# Patient Record
Sex: Female | Born: 1970 | Race: Black or African American | Hispanic: No | Marital: Single | Smoking: Current some day smoker
Health system: Southern US, Community
[De-identification: ages and names within clinical notes are randomized; demographics above are authoritative.]

## PROBLEM LIST (undated history)

## (undated) DIAGNOSIS — I10 Essential (primary) hypertension: Secondary | ICD-10-CM

## (undated) HISTORY — DX: Essential (primary) hypertension: I10

## (undated) HISTORY — PX: LASIK: SHX215

---

## 1999-04-09 ENCOUNTER — Ambulatory Visit (HOSPITAL_COMMUNITY): Admission: RE | Admit: 1999-04-09 | Discharge: 1999-04-09 | Payer: Self-pay | Admitting: *Deleted

## 1999-06-20 ENCOUNTER — Inpatient Hospital Stay (HOSPITAL_COMMUNITY): Admission: AD | Admit: 1999-06-20 | Discharge: 1999-06-20 | Payer: Self-pay | Admitting: *Deleted

## 1999-06-22 ENCOUNTER — Observation Stay (HOSPITAL_COMMUNITY): Admission: AD | Admit: 1999-06-22 | Discharge: 1999-06-23 | Payer: Self-pay | Admitting: *Deleted

## 1999-09-03 ENCOUNTER — Encounter: Payer: Self-pay | Admitting: *Deleted

## 1999-09-03 ENCOUNTER — Ambulatory Visit (HOSPITAL_COMMUNITY): Admission: RE | Admit: 1999-09-03 | Discharge: 1999-09-03 | Payer: Self-pay | Admitting: *Deleted

## 1999-10-06 ENCOUNTER — Inpatient Hospital Stay (HOSPITAL_COMMUNITY): Admission: AD | Admit: 1999-10-06 | Discharge: 1999-10-08 | Payer: Self-pay | Admitting: *Deleted

## 1999-10-19 ENCOUNTER — Inpatient Hospital Stay (HOSPITAL_COMMUNITY): Admission: AD | Admit: 1999-10-19 | Discharge: 1999-10-19 | Payer: Self-pay | Admitting: *Deleted

## 1999-10-20 ENCOUNTER — Inpatient Hospital Stay (HOSPITAL_COMMUNITY): Admission: AD | Admit: 1999-10-20 | Discharge: 1999-10-20 | Payer: Self-pay | Admitting: *Deleted

## 1999-10-21 ENCOUNTER — Inpatient Hospital Stay (HOSPITAL_COMMUNITY): Admission: EM | Admit: 1999-10-21 | Discharge: 1999-10-21 | Payer: Self-pay | Admitting: *Deleted

## 1999-11-18 ENCOUNTER — Inpatient Hospital Stay (HOSPITAL_COMMUNITY): Admission: AD | Admit: 1999-11-18 | Discharge: 1999-11-18 | Payer: Self-pay | Admitting: *Deleted

## 2000-02-10 ENCOUNTER — Inpatient Hospital Stay (HOSPITAL_COMMUNITY): Admission: AD | Admit: 2000-02-10 | Discharge: 2000-02-10 | Payer: Self-pay | Admitting: *Deleted

## 2001-12-15 ENCOUNTER — Emergency Department (HOSPITAL_COMMUNITY): Admission: EM | Admit: 2001-12-15 | Discharge: 2001-12-15 | Payer: Self-pay | Admitting: Emergency Medicine

## 2001-12-20 ENCOUNTER — Emergency Department (HOSPITAL_COMMUNITY): Admission: EM | Admit: 2001-12-20 | Discharge: 2001-12-20 | Payer: Self-pay | Admitting: Emergency Medicine

## 2001-12-28 ENCOUNTER — Other Ambulatory Visit: Admission: RE | Admit: 2001-12-28 | Discharge: 2001-12-28 | Payer: Self-pay | Admitting: Obstetrics and Gynecology

## 2002-05-02 ENCOUNTER — Inpatient Hospital Stay: Admission: AD | Admit: 2002-05-02 | Discharge: 2002-05-02 | Payer: Self-pay | Admitting: Obstetrics and Gynecology

## 2002-05-16 ENCOUNTER — Inpatient Hospital Stay (HOSPITAL_COMMUNITY): Admission: AD | Admit: 2002-05-16 | Discharge: 2002-05-16 | Payer: Self-pay | Admitting: Obstetrics and Gynecology

## 2002-06-07 ENCOUNTER — Inpatient Hospital Stay (HOSPITAL_COMMUNITY): Admission: AD | Admit: 2002-06-07 | Discharge: 2002-06-10 | Payer: Self-pay | Admitting: Obstetrics and Gynecology

## 2004-01-17 ENCOUNTER — Inpatient Hospital Stay (HOSPITAL_COMMUNITY): Admission: AD | Admit: 2004-01-17 | Discharge: 2004-01-17 | Payer: Self-pay | Admitting: Obstetrics & Gynecology

## 2004-03-25 ENCOUNTER — Ambulatory Visit (HOSPITAL_COMMUNITY): Admission: RE | Admit: 2004-03-25 | Discharge: 2004-03-25 | Payer: Self-pay | Admitting: *Deleted

## 2004-04-01 ENCOUNTER — Ambulatory Visit (HOSPITAL_COMMUNITY): Admission: RE | Admit: 2004-04-01 | Discharge: 2004-04-01 | Payer: Self-pay | Admitting: *Deleted

## 2004-06-12 ENCOUNTER — Ambulatory Visit (HOSPITAL_COMMUNITY): Admission: RE | Admit: 2004-06-12 | Discharge: 2004-06-12 | Payer: Self-pay | Admitting: *Deleted

## 2004-07-04 ENCOUNTER — Observation Stay (HOSPITAL_COMMUNITY): Admission: AD | Admit: 2004-07-04 | Discharge: 2004-07-05 | Payer: Self-pay | Admitting: *Deleted

## 2004-07-18 ENCOUNTER — Inpatient Hospital Stay (HOSPITAL_COMMUNITY): Admission: AD | Admit: 2004-07-18 | Discharge: 2004-07-20 | Payer: Self-pay | Admitting: *Deleted

## 2004-07-21 ENCOUNTER — Ambulatory Visit: Payer: Self-pay | Admitting: *Deleted

## 2004-07-27 ENCOUNTER — Inpatient Hospital Stay (HOSPITAL_COMMUNITY): Admission: AD | Admit: 2004-07-27 | Discharge: 2004-07-27 | Payer: Self-pay | Admitting: Family Medicine

## 2004-07-31 ENCOUNTER — Encounter: Admission: RE | Admit: 2004-07-31 | Discharge: 2004-07-31 | Payer: Self-pay | Admitting: *Deleted

## 2004-08-08 ENCOUNTER — Ambulatory Visit: Payer: Self-pay | Admitting: Family Medicine

## 2004-08-08 ENCOUNTER — Inpatient Hospital Stay (HOSPITAL_COMMUNITY): Admission: AD | Admit: 2004-08-08 | Discharge: 2004-08-08 | Payer: Self-pay | Admitting: Gynecology

## 2004-08-11 ENCOUNTER — Ambulatory Visit: Payer: Self-pay | Admitting: Family Medicine

## 2004-08-11 ENCOUNTER — Inpatient Hospital Stay (HOSPITAL_COMMUNITY): Admission: AD | Admit: 2004-08-11 | Discharge: 2004-08-11 | Payer: Self-pay | Admitting: Obstetrics and Gynecology

## 2004-08-11 ENCOUNTER — Inpatient Hospital Stay (HOSPITAL_COMMUNITY): Admission: AD | Admit: 2004-08-11 | Discharge: 2004-08-12 | Payer: Self-pay | Admitting: Obstetrics and Gynecology

## 2004-08-12 ENCOUNTER — Inpatient Hospital Stay (HOSPITAL_COMMUNITY): Admission: AD | Admit: 2004-08-12 | Discharge: 2004-08-13 | Payer: Self-pay | Admitting: *Deleted

## 2004-08-12 ENCOUNTER — Ambulatory Visit: Payer: Self-pay | Admitting: Family Medicine

## 2004-08-13 ENCOUNTER — Inpatient Hospital Stay (HOSPITAL_COMMUNITY): Admission: AD | Admit: 2004-08-13 | Discharge: 2004-08-15 | Payer: Self-pay | Admitting: Obstetrics and Gynecology

## 2005-01-31 IMAGING — US US OB DETAIL+14 WK
1 series · 13 of 28 positions shown · non-contrast
Comparison: none

CLINICAL DATA: Increased risk for Down syndrome by serum screening.  G5 P4. EDC 09/05/04 by first ultrasound.

[Series 1: unknown · 0.26mm/px · 13 of 80 slices shown]
[im 3/80]
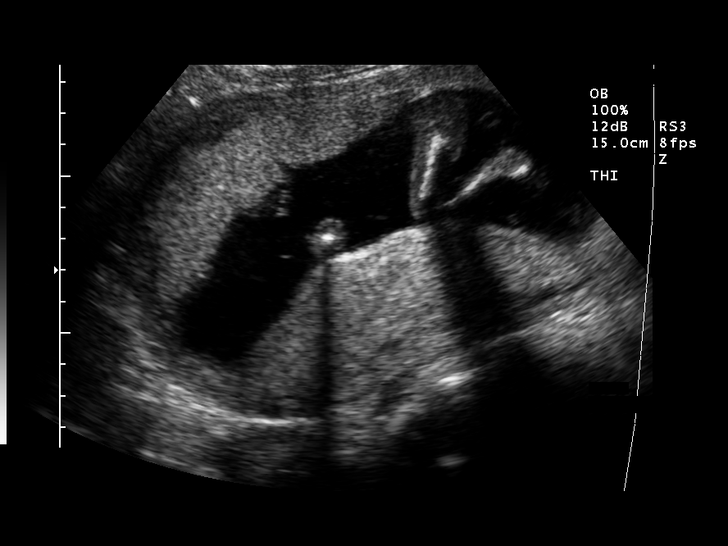
[im 9/80]
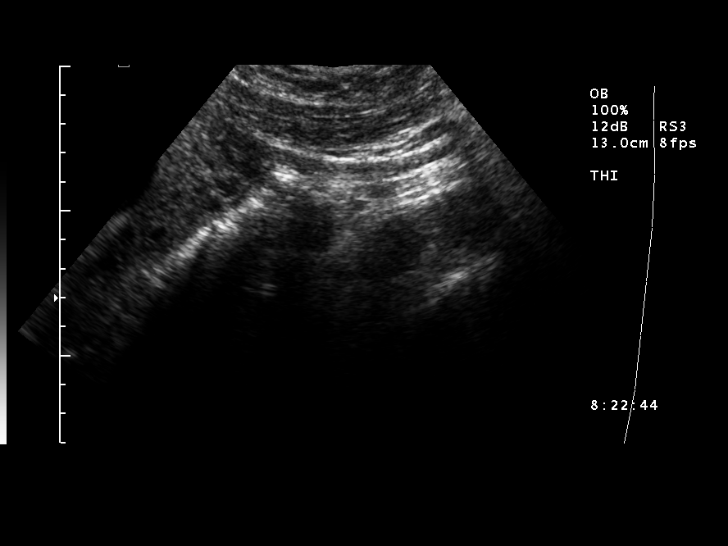
[im 15/80]
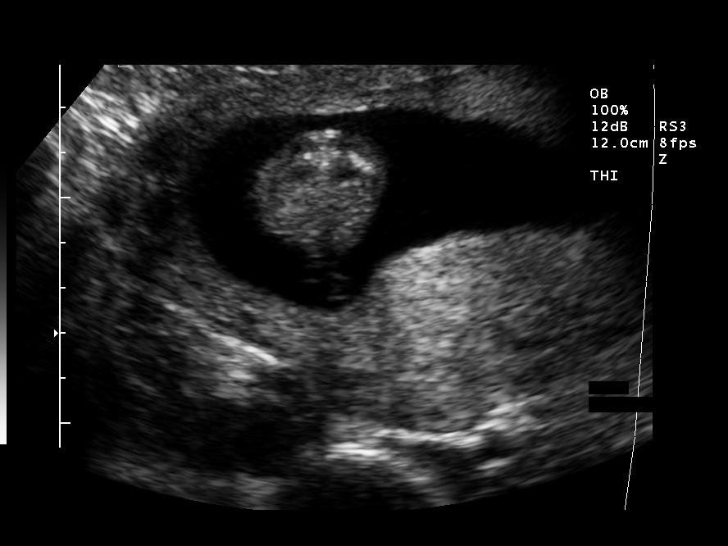
[im 21/80]
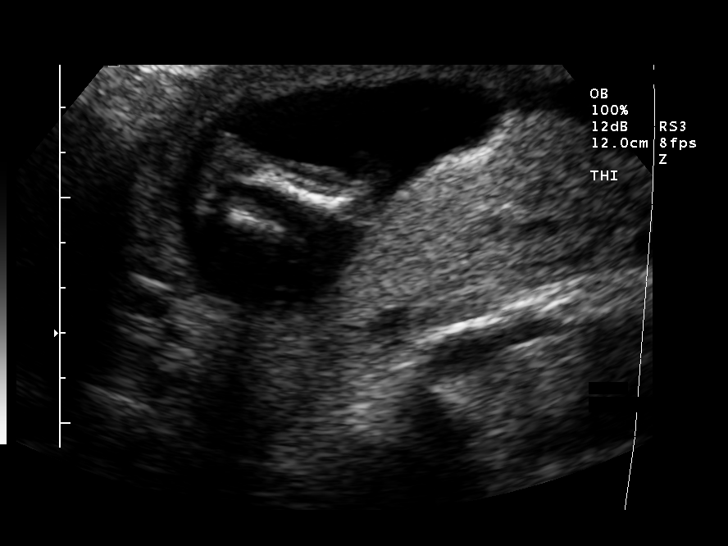
[im 27/80]
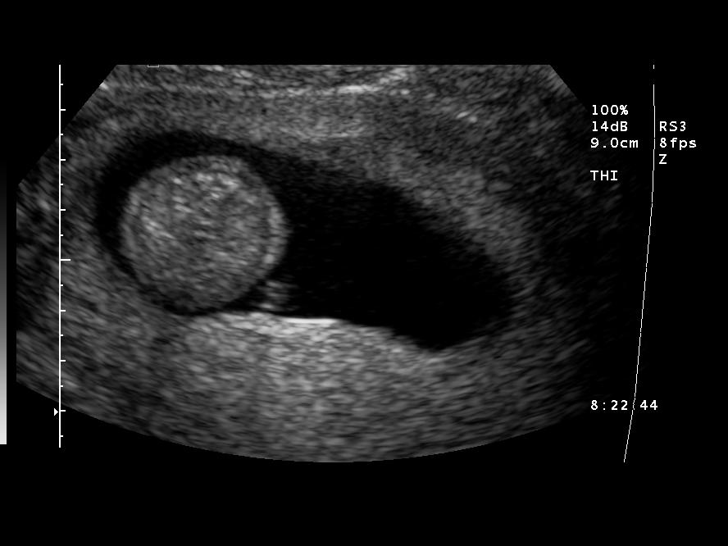
[im 33/80]
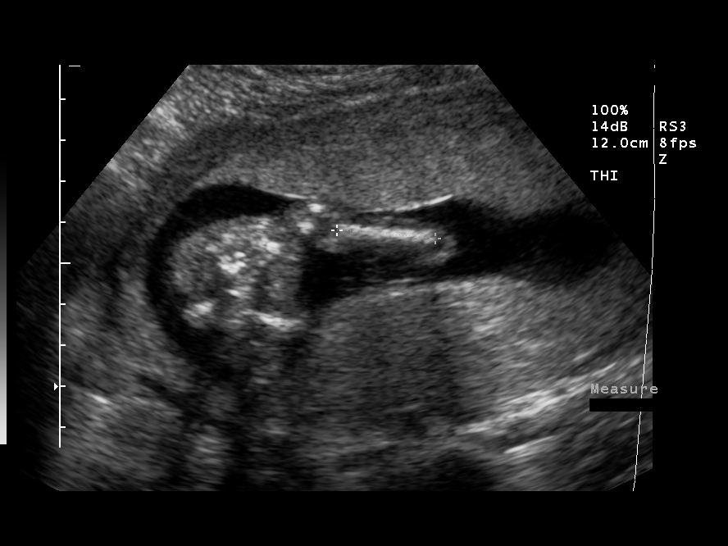
[im 41/80]
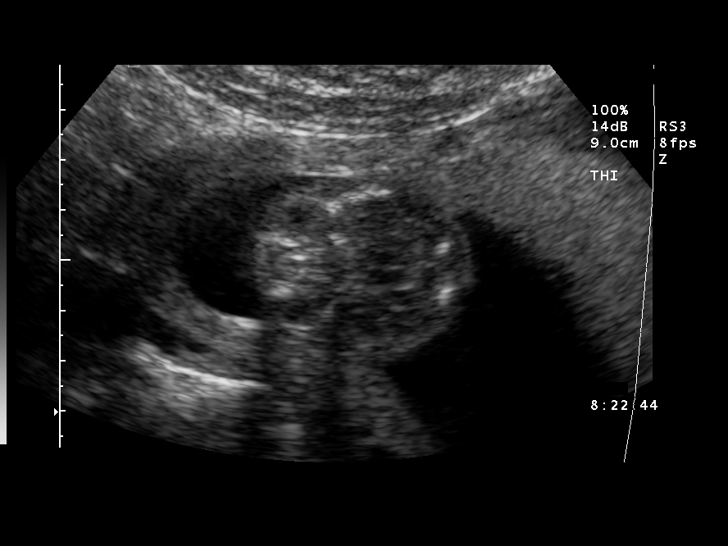
[im 47/80]
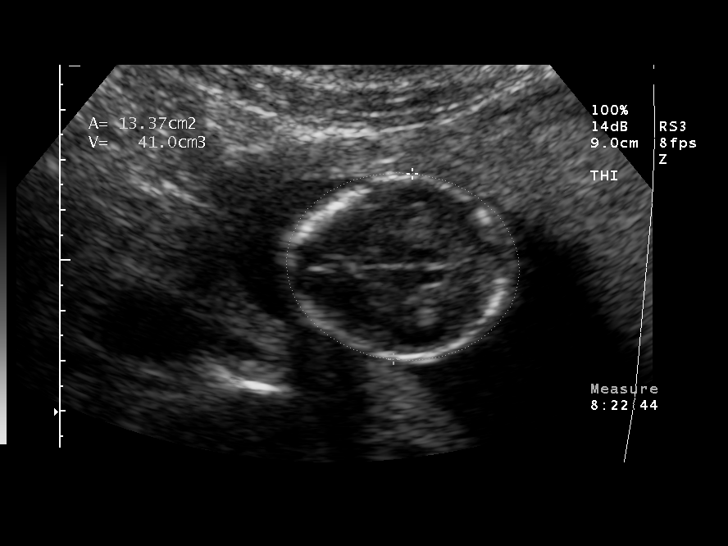
[im 53/80]
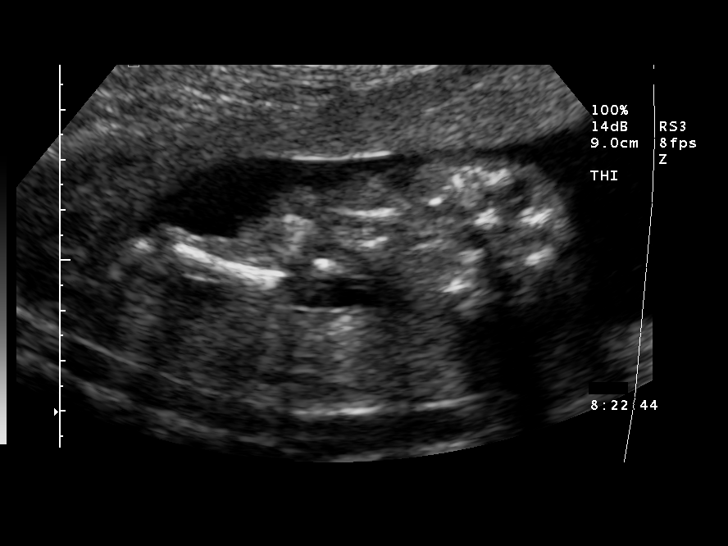
[im 59/80]
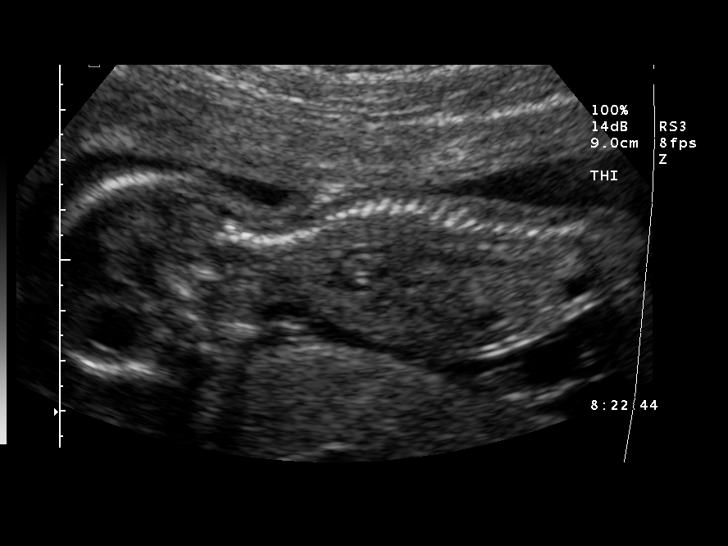
[im 65/80]
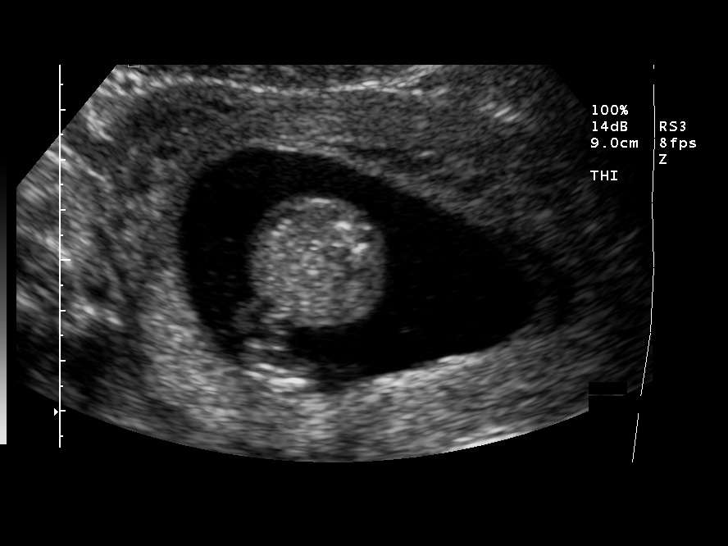
[im 71/80]
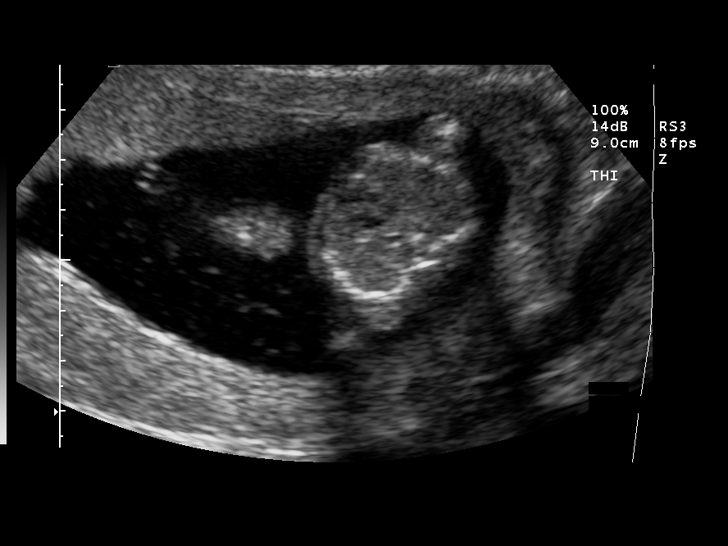
[im 77/80]
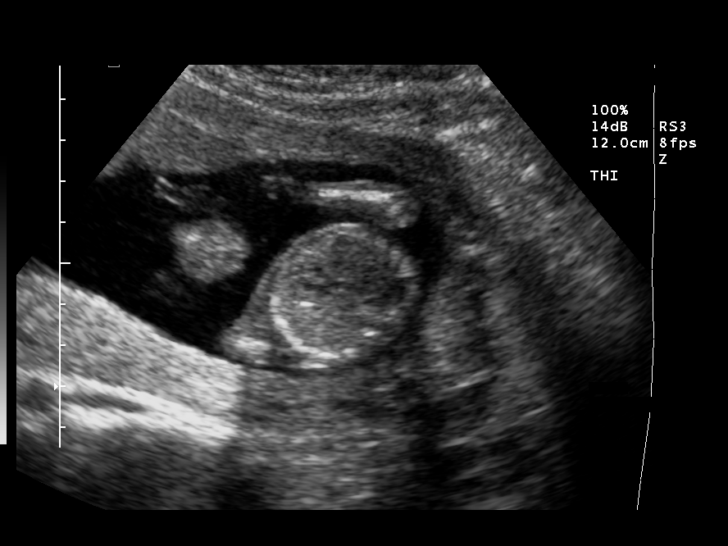

[13 of 28 positions shown; findings below may reference images not displayed]

DETAILED OBSTETRICAL ULTRASOUND 

Number of Fetuses:  1
Heart Rate:  168
Movement:  Yes
Breathing:  No
Presentation:  Breech
Placental Location:  Anterior
Grade:  I
Previa:  No
Amniotic Fluid (Subjective):  Normal
Amniotic Fluid (Objective):  3.8 cm Vertical pocket 

FETAL BIOMETRY
BPD:  3.6 cm   17 w 0 d
HC:  13.2 cm   16 w 5 d
AC:  10.5 cm   16 w 3 d
FL:  2.0 cm   16 w 0 d
HL:  2.3 cm  17 w 1 d

MEAN GA:  16 w 5 d
1ST US GA:  16 w 4 d (Assigned)
FETAL ANATOMY
Lateral Ventricles:  Visualized 
Thalami/CSP:  Visualized 
Posterior Fossa:  Visualized 
Nuchal Region:  Visualized 
Spine:  Visualized 
4 Chamber Heart on Left:  Not visualized 
Stomach on Left:  Visualized 
3 Vessel Cord:  Visualized 
Cord Insertion Site:  Visualized 
Kidneys:  Visualized 
Bladder:  Visualized 
Extremities:  Visualized 

ADDITIONAL ANATOMY VISUALIZED:  Upper lip, orbits, diaphragm, heel, 5th digit, ductal arch, aortic arch, and female genitalia
MATERNAL FINDINGS
Cervix:  3.3 cm Transabdominally
IMPRESSION: Single living intrauterine fetus in breech presentation.  Patient is 16 weeks and 4 days by first ultrasound and measures 16 weeks 5 days today indicating appropriate growth.
Cardiac anatomy and profile (including nasal bone) not seen.  Otherwise, no anatomic abnormality noted.  Patient was informed that ultrasound cannot exclude Down syndrome.

</u12:p>

## 2005-02-05 ENCOUNTER — Emergency Department (HOSPITAL_COMMUNITY): Admission: EM | Admit: 2005-02-05 | Discharge: 2005-02-05 | Payer: Self-pay | Admitting: Emergency Medicine

## 2005-06-25 ENCOUNTER — Emergency Department (HOSPITAL_COMMUNITY): Admission: EM | Admit: 2005-06-25 | Discharge: 2005-06-25 | Payer: Self-pay | Admitting: Emergency Medicine

## 2006-05-17 ENCOUNTER — Emergency Department (HOSPITAL_COMMUNITY): Admission: EM | Admit: 2006-05-17 | Discharge: 2006-05-17 | Payer: Self-pay | Admitting: Emergency Medicine

## 2008-06-30 ENCOUNTER — Emergency Department (HOSPITAL_COMMUNITY): Admission: EM | Admit: 2008-06-30 | Discharge: 2008-06-30 | Payer: Self-pay | Admitting: Emergency Medicine

## 2008-12-11 ENCOUNTER — Inpatient Hospital Stay (HOSPITAL_COMMUNITY): Admission: AD | Admit: 2008-12-11 | Discharge: 2008-12-11 | Payer: Self-pay | Admitting: Obstetrics & Gynecology

## 2009-04-26 ENCOUNTER — Inpatient Hospital Stay (HOSPITAL_COMMUNITY): Admission: AD | Admit: 2009-04-26 | Discharge: 2009-04-26 | Payer: Self-pay | Admitting: Obstetrics & Gynecology

## 2010-06-03 ENCOUNTER — Inpatient Hospital Stay (HOSPITAL_COMMUNITY): Admission: AD | Admit: 2010-06-03 | Discharge: 2010-06-03 | Payer: Self-pay | Admitting: Obstetrics & Gynecology

## 2010-06-03 ENCOUNTER — Ambulatory Visit: Payer: Self-pay | Admitting: Nurse Practitioner

## 2010-09-29 ENCOUNTER — Inpatient Hospital Stay (HOSPITAL_COMMUNITY): Admission: AD | Admit: 2010-09-29 | Discharge: 2010-09-29 | Payer: Self-pay | Admitting: Obstetrics & Gynecology

## 2010-09-29 ENCOUNTER — Ambulatory Visit: Payer: Self-pay | Admitting: Family

## 2010-09-29 ENCOUNTER — Inpatient Hospital Stay (HOSPITAL_COMMUNITY): Admission: AD | Admit: 2010-09-29 | Discharge: 2010-09-30 | Payer: Self-pay | Admitting: Family Medicine

## 2011-03-05 LAB — URINE MICROSCOPIC-ADD ON

## 2011-03-05 LAB — HCG, QUANTITATIVE, PREGNANCY: hCG, Beta Chain, Quant, S: <2 m[IU]/mL (ref <5)

## 2011-03-05 LAB — CBC
MCHC: 34.1 g/dL (ref 30.0–36.0)
RBC: 3.31 MIL/uL — ABNORMAL LOW (ref 3.87–5.11)
RDW: 12.3 % (ref 11.5–15.5)
WBC: 6.4 10*3/uL (ref 4.0–10.5)

## 2011-03-05 LAB — URINALYSIS, ROUTINE W REFLEX MICROSCOPIC
Bilirubin Urine: NEGATIVE
Ketones, ur: NEGATIVE mg/dL

## 2011-03-05 LAB — POCT PREGNANCY, URINE: Preg Test, Ur: NEGATIVE

## 2011-03-09 LAB — URINALYSIS, ROUTINE W REFLEX MICROSCOPIC
Bilirubin Urine: NEGATIVE
Hgb urine dipstick: NEGATIVE
Nitrite: NEGATIVE
Protein, ur: NEGATIVE mg/dL
Urobilinogen, UA: 1 mg/dL (ref 0.0–1.0)

## 2011-03-31 LAB — CBC
HCT: 36.7 % (ref 36.0–46.0)
Hemoglobin: 12.9 g/dL (ref 12.0–15.0)
MCV: 101.7 fL — ABNORMAL HIGH (ref 78.0–100.0)
Platelets: 166 10*3/uL (ref 150–400)

## 2011-03-31 LAB — GC/CHLAMYDIA PROBE AMP, GENITAL: GC Probe Amp, Genital: NEGATIVE

## 2011-03-31 LAB — URINALYSIS, ROUTINE W REFLEX MICROSCOPIC
Bilirubin Urine: NEGATIVE
Hgb urine dipstick: NEGATIVE
Nitrite: NEGATIVE
Protein, ur: NEGATIVE mg/dL
Urobilinogen, UA: 0.2 mg/dL (ref 0.0–1.0)

## 2011-03-31 LAB — DIFFERENTIAL
Basophils Absolute: 0 10*3/uL (ref 0.0–0.1)
Basophils Relative: 1 % (ref 0–1)
Eosinophils Absolute: 0.1 10*3/uL (ref 0.0–0.7)
Lymphocytes Relative: 36 % (ref 12–46)
Lymphs Abs: 1.8 10*3/uL (ref 0.7–4.0)
Neutrophils Relative %: 56 % (ref 43–77)

## 2011-03-31 LAB — WET PREP, GENITAL
Trich, Wet Prep: NONE SEEN
Yeast Wet Prep HPF POC: NONE SEEN

## 2011-05-08 NOTE — Discharge Summary (Signed)
NAME:  Alicia Fry, Alicia Fry                         ACCOUNT NO.:  [id number]   MEDICAL RECORD NO.:  [id number]                   PATIENT TYPE:  INP   LOCATION:  9169                                 FACILITY:  [facility]   PHYSICIAN:  Conni Elliot, M.D.             DATE OF BIRTH:  10-07-1971   DATE OF ADMISSION:  07/18/2004  DATE OF DISCHARGE:  07/20/2004                                 DISCHARGE SUMMARY   ADMISSION DIAGNOSES:  16. A 40 year old G5, P4-0-0-4, at 33 weeks 5 days.  2. Preterm labor.   DISCHARGE DIAGNOSES:  93. A 40 year old G5, P4-0-0-4, at 34 weeks.  2. Cessation of preterm labor.   DISCHARGE MEDICATIONS:  Prenatal vitamin one tablet p.o. daily.   ADMISSION HISTORY:  Ms. Alicia Fry is a 40 year old G5, P4-0-0-4, that presents  to the MAU at 33 weeks 5 days complaining of uterine contractions that  started at 3:30 a.m. on the morning of July 25.  Contractions were occurring  every five minutes.  The patient denied vaginal bleeding or leakage of  fluid.  Positive fetal activity and contractions.  The patient also was  complaining of back pain and bilateral leg numbness.   HOSPITAL COURSE:  The patient was assessed in the MAU.  Vital signs:  Temperature 98.8, pulse 70, respiration rate 20, BP 131/76.  Digital  cervical exam 1, soft, and high.  FHTs 140-150.  Positive variability.  Positive reactivity.  No decelerations.  The patient was having uterine  contractions every five to seven minutes, which decreased to irregular  uterine irritability pattern.  The patient was admitted to the L&D unit and  was given terbutaline 0.25 mg x1 dose, was also started un Unasyn 3 g IV  q.6h.  Uterine contractions resolved until the next day, in which she  received one dosage of terbutaline 0.25 mg.  Uterine irritability has  resolved, the patient's back pain better since admission.  On day of  discharge, July 20, 2004, the patient's __________ is unchanged an was  discharged home in stable  condition.   CONDITION ON DISCHARGE:  Stable.  No contractions.   INSTRUCTIONS GIVEN TO PATIENT:  The patient was advised to be on bed rest as  much as possible.  The patient stated that she has four kids at home, but  she will stay at her mother's house for social support.  The patient was  advised that she needs to return to the MAU if she starts contracting or if  she has bleeding or leakage of fluid.  The patient voiced understanding to  plan of care.  The patient also voiced that she will make a follow-up  appointment in the high-risk clinic on Wednesday, August 3.     Bonnita Hollow, M.D.               Conni Elliot, M.D.    VRE/MEDQ  D:  07/20/2004  T:  07/21/2004  Job:  [id number]

## 2011-05-08 NOTE — H&P (Signed)
[facility]  Patient:    Alicia Fry, Alicia Fry Visit Number: [id number] MRN: [id number]          Service Type: OBS Location: 910B [id number] [id number] Attending Physician:  Leonard Schwartz Dictated by:   Marcelle Smiling Clelia Croft, C.N.M. Admit Date:  06/07/2002                           History and Physical  TWENTY-THREE HOUR OBSERVATION  DATE OF BIRTH:                02/23/1971  HISTORY OF PRESENT ILLNESS:   The patient is a 40 year old single black female gravida 4 para 3-0-0-3 at 65 and five sevenths weeks who presents with regular uterine contractions since 3 p.m. that became stronger and regular since 8:30 p.m.  She denies leaking, bleeding, headache, nausea and vomiting, and visual disturbances.  She reports positive fetal movement.  Her pregnancy has been followed by the [facility] certified nurse midwife service and has been remarkable for: 1. Smoker. 2. History of positive group B strep and positive group B strep this    pregnancy. 3. History of sexually transmitted diseases.  PRENATAL LABORATORY DATA:     Her prenatal lab work was collected on December 28, 2001.  Hemoglobin 12.0; hematocrit 34.3; platelets 202,000.  Blood type O positive, antibody negative.  Sickle cell trait negative.  RPR nonreactive. Rubella immune.  Hepatitis B surface antigen negative.  HIV nonreactive.  Pap smear within normal limits.  Gonorrhea negative, chlamydia negative.  Maternal serum alpha-fetoprotein within normal range.  On March 23, 2002 her one-hour Glucola was 112 and her hemoglobin at that time was 11.2.  Culture of the vaginal tract on May 04, 2002 in maternity admissions was negative.  Culture of the vaginal tract for group B strep on May 19, 2002 at [facility] was positive.  HISTORY OF PRESENT PREGNANCY:                    She presented for care on December 28, 2001 at approximately 15.[redacted] weeks gestation.  She was measuring size greater  than dates at that time and pregnancy ultrasonography was performed at [redacted] weeks gestation that showed growth consistent with last menstrual period, giving her an EDD of June 16, 2002.  The patient remained normotensive with negative proteinuria throughout her prenatal care.  She was evaluated at 34 weeks in maternity admissions with contractions, for which she was given terbutaline.  She was measuring size greater than dates in the third trimester and therefore had a second pregnancy ultrasonography at [redacted] weeks gestation which showed appropriate growth at the 47th percentile with normal fluid.  The rest of her prenatal care was unremarkable.  OBSTETRICAL HISTORY:          She is a gravida 4 para 3-0-0-3.  In June 1995 she vaginally delivered a female infant at [redacted] weeks gestation after 4-6 hours in labor.  He weighed 6 pounds 10 ounces.  His name is Marquee.  She had no complications with that birth and she had chlamydia and Trichomonas with that pregnancy.  In June 1998 she vaginally delivered a female infant after 4-6 hours of labor at [redacted] weeks gestation.  The infant weighed 6 pounds 10 ounces. Her name was Destiny.  In October 2000 she vaginally delivered a female infant at [redacted] weeks gestation after 8 hours in labor.  The infant  weighed 6 pounds 10 ounces.  Her name is [place].  She had group B strep with that pregnancy. She had a history of anemia with her first pregnancy.  ALLERGIES:                    No medication allergies.  MEDICAL HISTORY:              She reports having had the usual childhood illnesses.  She was treated for chlamydia with her first pregnancy, treated for Trichomonas with her first pregnancy, and reports having a abnormal Pap smear x1 with repeat being normal.  SURGICAL HISTORY:             Remarkable for a laser surgery on her eye two years ago.  FAMILY HISTORY:               Remarkable for paternal grandmother with myocardial infarction.  Father with heart  disease.  Mother, father, sister, and paternal grandmother with chronic hypertension.  Mother, sisters, and brother with diabetes.  Sister has chronic renal disease secondary to diabetes.  Paternal grandmother with a history of CVA.  GENETIC HISTORY:              Remarkable for father of the baby born with extra finger.  Father of the baby with possible sickle cell trait.  SOCIAL HISTORY:               The father-of-the-babys name is [place].  He is involved and supportive.  They are of the [employer]al faith.  The patient has some college education.  The father of the baby has 11 years of high school education.  The patient smoked throughout the pregnancy but  cut down significantly to approximately one cigarette per week.  Denies any alcohol or illicit drug use with the pregnancy.  OBJECTIVE DATA:  VITAL SIGNS:                  Stable.  She is afebrile.  HEENT:                        Grossly within normal limits.  CHEST:                        Clear to auscultation.  HEART:                        Regular to rate and rhythm.  ABDOMEN:                      Gravid in contour with fundal height extending approximately 40 cm above the pubic symphysis.  Electronic fetal monitoring is remarkable for fetal heart rate with decelerations in relation to uterine contractions x2 down to the 80s lasting a minute-and-a-half.  Spontaneous recovery after application of O2 and position changes.  Uterine contractions every 15 minutes with intermittent irritability.  PELVIC:                       Cervical exam is tight 1 cm, 50% effaced, multiparous and soft, posterior, vertex high.  EXTREMITIES:                  Within normal limits.  ASSESSMENT:                   1. Intrauterine pregnancy at term.  2. Prodromal versus early labor.                               3. Fetal heart rate changes.  PLAN:                         1. Admit to birthing suites per consult with                                   Dr. Stefano Gaul.                               2. Observe fetal heart rate overnight.                                3. Given the status of her unfavorable cervix,                                  induction of labor is not recommended at this                                  time.Dictated by:   Marcelle Smiling Clelia Croft, C.N.M.  Attending Physician:  Leonard Schwartz DD:  06/08/02 TD:  06/08/02 Job: [id number]

## 2011-09-13 ENCOUNTER — Emergency Department (HOSPITAL_COMMUNITY): Payer: Medicaid Other

## 2011-09-13 ENCOUNTER — Emergency Department (HOSPITAL_COMMUNITY)
Admission: EM | Admit: 2011-09-13 | Discharge: 2011-09-14 | Disposition: A | Discharge status: Home / Self Care | Payer: Medicaid Other | Attending: Emergency Medicine | Admitting: Emergency Medicine

## 2011-09-13 DIAGNOSIS — M542 Cervicalgia: Secondary | ICD-10-CM | POA: Insufficient documentation

## 2011-09-13 DIAGNOSIS — M25519 Pain in unspecified shoulder: Secondary | ICD-10-CM | POA: Insufficient documentation

## 2011-09-13 DIAGNOSIS — M79609 Pain in unspecified limb: Secondary | ICD-10-CM | POA: Insufficient documentation

## 2011-09-13 DIAGNOSIS — T148XXA Other injury of unspecified body region, initial encounter: Secondary | ICD-10-CM | POA: Insufficient documentation

## 2011-09-13 DIAGNOSIS — S12400A Unspecified displaced fracture of fifth cervical vertebra, initial encounter for closed fracture: Secondary | ICD-10-CM | POA: Insufficient documentation

## 2011-09-13 DIAGNOSIS — Z23 Encounter for immunization: Secondary | ICD-10-CM | POA: Insufficient documentation

## 2011-09-13 DIAGNOSIS — R51 Headache: Secondary | ICD-10-CM | POA: Insufficient documentation

## 2011-09-18 ENCOUNTER — Emergency Department (HOSPITAL_COMMUNITY)
Admission: EM | Admit: 2011-09-18 | Discharge: 2011-09-18 | Disposition: A | Discharge status: Home / Self Care | Payer: Medicaid Other | Attending: Emergency Medicine | Admitting: Emergency Medicine

## 2011-09-18 DIAGNOSIS — M79609 Pain in unspecified limb: Secondary | ICD-10-CM | POA: Insufficient documentation

## 2011-09-18 DIAGNOSIS — M25519 Pain in unspecified shoulder: Secondary | ICD-10-CM | POA: Insufficient documentation

## 2011-09-18 DIAGNOSIS — IMO0002 Reserved for concepts with insufficient information to code with codable children: Secondary | ICD-10-CM | POA: Insufficient documentation

## 2011-09-18 DIAGNOSIS — M542 Cervicalgia: Secondary | ICD-10-CM | POA: Insufficient documentation

## 2011-09-25 LAB — URINALYSIS, ROUTINE W REFLEX MICROSCOPIC
Glucose, UA: NEGATIVE mg/dL
Urobilinogen, UA: 0.2 mg/dL (ref 0.0–1.0)

## 2011-09-25 LAB — CBC
Hemoglobin: 12.9 g/dL (ref 12.0–15.0)
RBC: 3.7 MIL/uL — ABNORMAL LOW (ref 3.87–5.11)
WBC: 5.7 10*3/uL (ref 4.0–10.5)

## 2011-10-19 ENCOUNTER — Other Ambulatory Visit (HOSPITAL_COMMUNITY): Payer: Self-pay | Admitting: Neurosurgery

## 2011-10-19 DIAGNOSIS — S129XXA Fracture of neck, unspecified, initial encounter: Secondary | ICD-10-CM

## 2011-10-19 DIAGNOSIS — M5410 Radiculopathy, site unspecified: Secondary | ICD-10-CM

## 2011-10-19 DIAGNOSIS — M542 Cervicalgia: Secondary | ICD-10-CM

## 2011-10-21 ENCOUNTER — Ambulatory Visit (HOSPITAL_COMMUNITY)
Admission: RE | Admit: 2011-10-21 | Discharge: 2011-10-21 | Disposition: A | Discharge status: Home / Self Care | Payer: No Typology Code available for payment source | Source: Ambulatory Visit | Attending: Neurosurgery | Admitting: Neurosurgery

## 2011-10-21 ENCOUNTER — Inpatient Hospital Stay (HOSPITAL_COMMUNITY): Admission: RE | Admit: 2011-10-21 | Payer: No Typology Code available for payment source | Source: Ambulatory Visit

## 2011-10-21 DIAGNOSIS — M4802 Spinal stenosis, cervical region: Secondary | ICD-10-CM | POA: Insufficient documentation

## 2011-10-21 DIAGNOSIS — Z4789 Encounter for other orthopedic aftercare: Secondary | ICD-10-CM | POA: Insufficient documentation
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
## 2015-02-13 ENCOUNTER — Emergency Department (HOSPITAL_COMMUNITY)
Admission: EM | Admit: 2015-02-13 | Discharge: 2015-02-13 | Disposition: A | Discharge status: Home / Self Care | Payer: Medicaid Other | Attending: Emergency Medicine | Admitting: Emergency Medicine

## 2015-02-13 ENCOUNTER — Encounter (HOSPITAL_COMMUNITY): Payer: Self-pay | Admitting: *Deleted

## 2015-02-13 DIAGNOSIS — M62838 Other muscle spasm: Secondary | ICD-10-CM

## 2015-02-13 DIAGNOSIS — Y998 Other external cause status: Secondary | ICD-10-CM | POA: Insufficient documentation

## 2015-02-13 DIAGNOSIS — Y92410 Unspecified street and highway as the place of occurrence of the external cause: Secondary | ICD-10-CM | POA: Insufficient documentation

## 2015-02-13 DIAGNOSIS — Z720 Tobacco use: Secondary | ICD-10-CM | POA: Insufficient documentation

## 2015-02-13 DIAGNOSIS — Y9389 Activity, other specified: Secondary | ICD-10-CM | POA: Insufficient documentation

## 2015-02-13 DIAGNOSIS — S4992XA Unspecified injury of left shoulder and upper arm, initial encounter: Secondary | ICD-10-CM | POA: Insufficient documentation

## 2015-02-13 MED ORDER — IBUPROFEN 800 MG PO TABS: 800.0000 mg | ORAL_TABLET | Freq: Three times a day (TID) | ORAL | Status: DC

## 2015-02-13 MED ORDER — CYCLOBENZAPRINE HCL 10 MG PO TABS: 10.0000 mg | ORAL_TABLET | Freq: Two times a day (BID) | ORAL | Status: DC | PRN
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
## 2019-07-15 ENCOUNTER — Other Ambulatory Visit: Payer: Self-pay

## 2019-07-15 ENCOUNTER — Encounter (HOSPITAL_COMMUNITY): Payer: Self-pay | Admitting: Emergency Medicine

## 2019-07-15 ENCOUNTER — Emergency Department (HOSPITAL_COMMUNITY)
Admission: EM | Admit: 2019-07-15 | Discharge: 2019-07-15 | Disposition: A | Discharge status: Home / Self Care | Payer: 59 | Attending: Emergency Medicine | Admitting: Emergency Medicine

## 2019-07-15 DIAGNOSIS — F17210 Nicotine dependence, cigarettes, uncomplicated: Secondary | ICD-10-CM | POA: Insufficient documentation

## 2019-07-15 DIAGNOSIS — I10 Essential (primary) hypertension: Secondary | ICD-10-CM | POA: Diagnosis present

## 2019-07-15 LAB — I-STAT CREATININE, ED: Creatinine, Ser: 0.9 mg/dL (ref 0.44–1.00)

## 2019-07-15 MED ORDER — HYDROCHLOROTHIAZIDE 25 MG PO TABS: 25.0000 mg | ORAL_TABLET | Freq: Every day | ORAL | 0 refills | Status: DC

## 2019-07-15 NOTE — ED Notes (Signed)
Patient verbalizes understanding of discharge instructions. Opportunity for questioning and answers were provided. Pt discharged from ED. 

## 2019-07-15 NOTE — Discharge Instructions (Signed)
Follow up with a primary care doctor. Call the number in the paperwork to help you establish care.  Take the hydrochlorothiazide (HCTZ). This will increase your urine output, so do not take it before bed. This is information about this medication in the paperwork.  Decrease your salt intake to help with your blood pressure.  Return to the ER if you develop persistent headaches, speech difficulty, numbness, difficulty walking, or any new, worsening, or concerning symptoms.

## 2019-07-15 NOTE — ED Triage Notes (Signed)
Pt was at urgent care and was told her BP was high, went for COVID test by request of her job. States family members have covid. Pt reports she did have a headache yesterday. No symptoms today.

## 2019-07-15 NOTE — ED Triage Notes (Signed)
Pt from urgent care; being seen at urgent care to be tested for covid; pt has been in contact with family who have been having symptoms and are being tested today; pt denies covid-related symptoms; while pt at UC, BP noted to be elevated; recommended that pt come to ED for evaluation; no HX of same; denies dizziness, blurred vision, endorses mild HA

## 2019-07-15 NOTE — ED Provider Notes (Signed)
[facility] Provider Note   CSN: [id number] Arrival date & time: 07/15/19  1640     History   Chief Complaint Chief Complaint  Patient presents with  . Hypertension    HPI Alicia Fry is a 48 y.o. female presenting for evaluation of hypertension.  Patient states she was at urgent care to get a COVID swab after multiple family members have had symptoms.  While at urgent care, is found that she was hypertensive.  Patient states she has been told that she has high blood pressure in the past, but never been treated for this.  Patient states she had a headache yesterday, but she currently does not have a headache.  She denies vision changes, slurred speech, dizziness, lightheadedness, chest pain, change in his urination, leg pain or swelling.  She states she has no medical problems, takes no medications daily.  She is interested in establishing with a primary care doctor.     HPI  History reviewed. No pertinent past medical history.  There are no active problems to display for this patient.   History reviewed. No pertinent surgical history.   OB History   No obstetric history on file.      Home Medications    Prior to Admission medications   Medication Sig Start Date End Date Taking? Authorizing Provider  cyclobenzaprine (FLEXERIL) 10 MG tablet Take 1 tablet (10 mg total) by mouth 2 (two) times daily as needed for muscle spasms. 02/13/15   Fransico Meadow, PA-C  hydrochlorothiazide (HYDRODIURIL) 25 MG tablet Take 1 tablet (25 mg total) by mouth daily. 07/15/19   Daquisha Clermont, PA-C  ibuprofen (ADVIL,MOTRIN) 800 MG tablet Take 1 tablet (800 mg total) by mouth 3 (three) times daily. 02/13/15   Fransico Meadow, PA-C    Family History No family history on file.  Social History Social History   Tobacco Use  . Smoking status: Current Some Day Smoker    Types: Cigarettes  . Smokeless tobacco: Never Used  Substance Use Topics  .  Alcohol use: No  . Drug use: No     Allergies   Patient has no known allergies.   Review of Systems Review of Systems  Neurological: Positive for headaches (Yesterday, resolved).  All other systems reviewed and are negative.    Physical Exam Updated Vital Signs BP (!) 175/111   Pulse 62   Temp 98.6 F (37 C) (Oral)   Resp 16   Ht 5\' 7"  (1.702 m)   Wt 99.8 kg   SpO2 100%   BMI 34.46 kg/m   Physical Exam Vitals signs and nursing note reviewed.  Constitutional:      General: She is not in acute distress.    Appearance: She is well-developed.     Comments: Resting comfortably in the bed in no acute distress  HENT:     Head: Normocephalic and atraumatic.  Eyes:     Extraocular Movements: Extraocular movements intact.     Conjunctiva/sclera: Conjunctivae normal.     Pupils: Pupils are equal, round, and reactive to light.     Comments: EOMI and PERRLA.  No nystagmus.  Neck:     Musculoskeletal: Normal range of motion and neck supple.  Cardiovascular:     Rate and Rhythm: Normal rate and regular rhythm.     Pulses: Normal pulses.  Pulmonary:     Effort: Pulmonary effort is normal. No respiratory distress.     Breath sounds: Normal breath sounds.  No wheezing.  Abdominal:     General: There is no distension.     Palpations: Abdomen is soft. There is no mass.     Tenderness: There is no abdominal tenderness. There is no guarding or rebound.  Musculoskeletal: Normal range of motion.  Skin:    General: Skin is warm and dry.     Capillary Refill: Capillary refill takes less than 2 seconds.  Neurological:     General: No focal deficit present.     Mental Status: She is alert and oriented to person, place, and time.     GCS: GCS eye subscore is 4. GCS verbal subscore is 5. GCS motor subscore is 6.     Cranial Nerves: Cranial nerves are intact.     Sensory: Sensation is intact.     Motor: Motor function is intact.     Coordination: Coordination is intact.     Gait:  Gait is intact.     Comments: No obvious neuro deficits. Cn intact. ambulatory without difficulty. Nose to finger intact. Fine movement and coordination intact      ED Treatments / Results  Labs (all labs ordered are listed, but only abnormal results are displayed) Labs Reviewed  I-STAT CREATININE, ED    EKG None  Radiology No results found.  Procedures Procedures (including critical care time)  Medications Ordered in ED Medications - No data to display   Initial Impression / Assessment and Plan / ED Course  I have reviewed the triage vital signs and the nursing notes.  Pertinent labs & imaging results that were available during my care of the patient were reviewed by me and considered in my medical decision making (see chart for details).        Pt presenting for evaluation of asymptomatic hypertension.  Physical exam reassuring, she appears nontoxic.  No neuro deficits.  Patient had a headache yesterday, but this resolved.  She was tested for COVID at the urgent care, will not repeat testing today.  Will obtain i-STAT creatinine and start patient on blood pressure medication.  Creatinine normal.  Will start on HCTZ.  Patient given information resources for primary care.  At this time, patient appears safe for discharge.  Return precautions given.  Patient states she understands and agrees to plan.   Final Clinical Impressions(s) / ED Diagnoses   Final diagnoses:  Hypertension, unspecified type    ED Discharge Orders         Ordered    hydrochlorothiazide (HYDRODIURIL) 25 MG tablet  Daily     07/15/19 1819           Franchot Heidelberg, PA-C 07/15/19 1913    Sherwood Gambler, MD 07/16/19 1600

## 2019-08-08 ENCOUNTER — Encounter: Payer: Self-pay | Admitting: Family Medicine

## 2019-08-08 ENCOUNTER — Ambulatory Visit (INDEPENDENT_AMBULATORY_CARE_PROVIDER_SITE_OTHER): Payer: 59 | Admitting: Family Medicine

## 2019-08-08 ENCOUNTER — Other Ambulatory Visit: Payer: Self-pay

## 2019-08-08 VITALS — BP 124/70 | HR 73 | Temp 98.7°F | Ht 68.5 in | Wt 219.4 lb

## 2019-08-08 DIAGNOSIS — Z7689 Persons encountering health services in other specified circumstances: Secondary | ICD-10-CM | POA: Diagnosis not present

## 2019-08-08 DIAGNOSIS — I10 Essential (primary) hypertension: Secondary | ICD-10-CM | POA: Diagnosis not present

## 2019-08-08 DIAGNOSIS — Z566 Other physical and mental strain related to work: Secondary | ICD-10-CM | POA: Diagnosis not present

## 2019-08-08 NOTE — Patient Instructions (Addendum)
Your blood pressure today is in goal range.   Continue on the once daily HCTZ.  Continue checking your blood pressures.  Goal blood pressure reading is less than 130/80. If you are seeing readings higher on several checks, let us know.   Eat a low sodium diet and discussed.   Return at your convenience for a fasting physical exam.      DASH Eating Plan DASH stands for "Dietary Approaches to Stop Hypertension." The DASH eating plan is a healthy eating plan that has been shown to reduce high blood pressure (hypertension). It may also reduce your risk for type 2 diabetes, heart disease, and stroke. The DASH eating plan may also help with weight loss. What are tips for following this plan?  General guidelines  Avoid eating more than 2,300 mg (milligrams) of salt (sodium) a day. If you have hypertension, you may need to reduce your sodium intake to 1,500 mg a day.  Limit alcohol intake to no more than 1 drink a day for nonpregnant women and 2 drinks a day for men. One drink equals 12 oz of beer, 5 oz of wine, or 1 oz of hard liquor.  Work with your health care provider to maintain a healthy body weight or to lose weight. Ask what an ideal weight is for you.  Get at least 30 minutes of exercise that causes your heart to beat faster (aerobic exercise) most days of the week. Activities may include walking, swimming, or biking.  Work with your health care provider or diet and nutrition specialist (dietitian) to adjust your eating plan to your individual calorie needs. Reading food labels   Check food labels for the amount of sodium per serving. Choose foods with less than 5 percent of the Daily Value of sodium. Generally, foods with less than 300 mg of sodium per serving fit into this eating plan.  To find whole grains, look for the word "whole" as the first word in the ingredient list. Shopping  Buy products labeled as "low-sodium" or "no salt added."  Buy fresh foods. Avoid canned  foods and premade or frozen meals. Cooking  Avoid adding salt when cooking. Use salt-free seasonings or herbs instead of table salt or sea salt. Check with your health care provider or pharmacist before using salt substitutes.  Do not fry foods. Cook foods using healthy methods such as baking, boiling, grilling, and broiling instead.  Cook with heart-healthy oils, such as olive, canola, soybean, or sunflower oil. Meal planning  Eat a balanced diet that includes: ? 5 or more servings of fruits and vegetables each day. At each meal, try to fill half of your plate with fruits and vegetables. ? Up to 6-8 servings of whole grains each day. ? Less than 6 oz of lean meat, poultry, or fish each day. A 3-oz serving of meat is about the same size as a deck of cards. One egg equals 1 oz. ? 2 servings of low-fat dairy each day. ? A serving of nuts, seeds, or beans 5 times each week. ? Heart-healthy fats. Healthy fats called Omega-3 fatty acids are found in foods such as flaxseeds and coldwater fish, like sardines, salmon, and mackerel.  Limit how much you eat of the following: ? Canned or prepackaged foods. ? Food that is high in trans fat, such as fried foods. ? Food that is high in saturated fat, such as fatty meat. ? Sweets, desserts, sugary drinks, and other foods with added sugar. ? Full-fat dairy products.  Do not salt foods before eating.  Try to eat at least 2 vegetarian meals each week.  Eat more home-cooked food and less restaurant, buffet, and fast food.  When eating at a restaurant, ask that your food be prepared with less salt or no salt, if possible. What foods are recommended? The items listed may not be a complete list. Talk with your dietitian about what dietary choices are best for you. Grains Whole-grain or whole-wheat bread. Whole-grain or whole-wheat pasta. Brown rice. [place] Morrow. Bulgur. Whole-grain and low-sodium cereals. Pita bread. Low-fat, low-sodium crackers.  Whole-wheat flour tortillas. Vegetables Fresh or frozen vegetables (raw, steamed, roasted, or grilled). Low-sodium or reduced-sodium tomato and vegetable juice. Low-sodium or reduced-sodium tomato sauce and tomato paste. Low-sodium or reduced-sodium canned vegetables. Fruits All fresh, dried, or frozen fruit. Canned fruit in natural juice (without added sugar). Meat and other protein foods Skinless chicken or [place]. Ground chicken or [place]. Pork with fat trimmed off. Fish and seafood. Egg whites. Dried beans, peas, or lentils. Unsalted nuts, nut butters, and seeds. Unsalted canned beans. Lean cuts of beef with fat trimmed off. Low-sodium, lean deli meat. Dairy Low-fat (1%) or fat-free (skim) milk. Fat-free, low-fat, or reduced-fat cheeses. Nonfat, low-sodium ricotta or cottage cheese. Low-fat or nonfat yogurt. Low-fat, low-sodium cheese. Fats and oils Soft margarine without trans fats. Vegetable oil. Low-fat, reduced-fat, or light mayonnaise and salad dressings (reduced-sodium). Canola, safflower, olive, soybean, and sunflower oils. Avocado. Seasoning and other foods Herbs. Spices. Seasoning mixes without salt. Unsalted popcorn and pretzels. Fat-free sweets. What foods are not recommended? The items listed may not be a complete list. Talk with your dietitian about what dietary choices are best for you. Grains Baked goods made with fat, such as croissants, muffins, or some breads. Dry pasta or rice meal packs. Vegetables Creamed or fried vegetables. Vegetables in a cheese sauce. Regular canned vegetables (not low-sodium or reduced-sodium). Regular canned tomato sauce and paste (not low-sodium or reduced-sodium). Regular tomato and vegetable juice (not low-sodium or reduced-sodium). Angie Fava. Olives. Fruits Canned fruit in a light or heavy syrup. Fried fruit. Fruit in cream or butter sauce. Meat and other protein foods Fatty cuts of meat. Ribs. Fried meat. Berniece Salines. Sausage. Bologna and other  processed lunch meats. Salami. Fatback. Hotdogs. Bratwurst. Salted nuts and seeds. Canned beans with added salt. Canned or smoked fish. Whole eggs or egg yolks. Chicken or [place] with skin. Dairy Whole or 2% milk, cream, and half-and-half. Whole or full-fat cream cheese. Whole-fat or sweetened yogurt. Full-fat cheese. Nondairy creamers. Whipped toppings. Processed cheese and cheese spreads. Fats and oils Butter. Stick margarine. Lard. Shortening. Ghee. Bacon fat. Tropical oils, such as coconut, palm kernel, or palm oil. Seasoning and other foods Salted popcorn and pretzels. Onion salt, garlic salt, seasoned salt, table salt, and sea salt. Worcestershire sauce. Tartar sauce. Barbecue sauce. Teriyaki sauce. Soy sauce, including reduced-sodium. Steak sauce. Canned and packaged gravies. Fish sauce. Oyster sauce. Cocktail sauce. Horseradish that you find on the shelf. Ketchup. Mustard. Meat flavorings and tenderizers. Bouillon cubes. Hot sauce and Tabasco sauce. Premade or packaged marinades. Premade or packaged taco seasonings. Relishes. Regular salad dressings. Where to find more information:  National Heart, Lung, and [place]: https://wilson-eaton.com/  American Heart Association: www.heart.org Summary  The DASH eating plan is a healthy eating plan that has been shown to reduce high blood pressure (hypertension). It may also reduce your risk for type 2 diabetes, heart disease, and stroke.  With the DASH eating plan, you should limit salt (sodium)  intake to 2,300 mg a day. If you have hypertension, you may need to reduce your sodium intake to 1,500 mg a day.  When on the DASH eating plan, aim to eat more fresh fruits and vegetables, whole grains, lean proteins, low-fat dairy, and heart-healthy fats.  Work with your health care provider or diet and nutrition specialist (dietitian) to adjust your eating plan to your individual calorie needs. This information is not intended to replace advice given to  you by your health care provider. Make sure you discuss any questions you have with your health care provider. Document Released: 11/26/2011 Document Revised: 11/19/2017 Document Reviewed: 11/30/2016 Elsevier Patient Education  2020 [employer].

## 2019-08-08 NOTE — Progress Notes (Signed)
   Subjective:    Patient ID: Alicia Fry, female    DOB: [birth year], 48 y.o.   MRN: [id number]  HPI Chief Complaint  Patient presents with  . new pt    new pt, high blood pressure, had to have covid testing due to mom having covid on 7/25 and bp was high so urgent care treated her for high bp   She is new to the practice and here to establish care.  States she has not had regular medical care in years due to not having health insurance.   She was recently seen in the ED and was told her BP was elevated. She started on HCTZ 25 mg. Reports doing well on this medication, taking it daily and no side effects.   Checks her BP everyday. Sees readings in the 130s/70s-80s.   Review of chart shows elevated BP in 2016 as well in the ED  Denies fever, chills, headache, dizziness, chest pain, palpitations, shortness of breath, abdominal pain, N/V/D, urinary symptoms, LE edema.    LMP: 08/02/2019   Works at [employer] in [employer].  States her job is very stressful.   Her grandmother recently passed away and her mother was diagnosed with Covid-19 but doing well now.   She stopped smoking in June 2020.  Alcohol use is occasional. No drug use.   Single. 5 kids, ages 61-16 Also has grandchildren.   Reviewed allergies, medications, past medical, surgical, family, and social history.   Review of Systems Pertinent positives and negatives in the history of present illness.     Objective:   Physical Exam BP 124/70   Pulse 73   Temp 98.7 F (37.1 C)   Ht 5' 8.5" (1.74 m)   Wt 219 lb 6.4 oz (99.5 kg)   LMP 08/02/2019   BMI 32.87 kg/m   Alert and oriented and in no distress. Cardiac exam shows a regular sinus rhythm without murmurs or gallops. Lungs are clear to auscultation. Extremities without edema. Skin is warm and dry. DTRs symmetric and normal, no clonus.       Assessment & Plan:  Essential hypertension - Plan: CBC with Differential/Platelet, Comprehensive  metabolic panel, BP has responded well to HCTZ. She denies any side effects. Will refill as needed. Counseling on low sodium diet. DASH diet handout given.   Work-related stress - Plan: recommend counseling. States she plans to get FMLA paperwork filled out to take time off. Discussed that I cannot do this for her.   Encounter to establish care - Plan: she will follow up for fasting CPE in the next couple of months.

## 2019-08-09 LAB — CBC WITH DIFFERENTIAL/PLATELET
Basophils Absolute: 0 10*3/uL (ref 0.0–0.2)
Basos: 0 %
EOS (ABSOLUTE): 0.1 10*3/uL (ref 0.0–0.4)
Eos: 1 %
Hematocrit: 37.6 % (ref 34.0–46.6)
Hemoglobin: 12.7 g/dL (ref 11.1–15.9)
Immature Grans (Abs): 0 10*3/uL (ref 0.0–0.1)
Immature Granulocytes: 0 %
Lymphocytes Absolute: 2.5 10*3/uL (ref 0.7–3.1)
Lymphs: 50 %
MCH: 31.2 pg (ref 26.6–33.0)
MCHC: 33.8 g/dL (ref 31.5–35.7)
MCV: 92 fL (ref 79–97)
Monocytes Absolute: 0.3 10*3/uL (ref 0.1–0.9)
Monocytes: 6 %
Neutrophils Absolute: 2.2 10*3/uL (ref 1.4–7.0)
Neutrophils: 43 %
Platelets: 212 10*3/uL (ref 150–450)
RBC: 4.07 x10E6/uL (ref 3.77–5.28)
RDW: 12.7 % (ref 11.7–15.4)
WBC: 5.2 10*3/uL (ref 3.4–10.8)

## 2019-08-09 LAB — COMPREHENSIVE METABOLIC PANEL
ALT: 13 IU/L (ref 0–32)
AST: 13 IU/L (ref 0–40)
Albumin/Globulin Ratio: 1.3 (ref 1.2–2.2)
Albumin: 4.3 g/dL (ref 3.8–4.8)
Alkaline Phosphatase: 105 IU/L (ref 39–117)
BUN/Creatinine Ratio: 10 (ref 9–23)
BUN: 10 mg/dL (ref 6–24)
Bilirubin Total: <0.2 mg/dL (ref 0.0–1.2)
CO2: 25 mmol/L (ref 20–29)
Calcium: 9.1 mg/dL (ref 8.7–10.2)
Chloride: 98 mmol/L (ref 96–106)
Creatinine, Ser: 1.02 mg/dL — ABNORMAL HIGH (ref 0.57–1.00)
GFR calc Af Amer: 76 mL/min/{1.73_m2} (ref >59)
GFR calc non Af Amer: 66 mL/min/{1.73_m2} (ref >59)
Globulin, Total: 3.2 g/dL (ref 1.5–4.5)
Glucose: 97 mg/dL (ref 65–99)
Potassium: 4.2 mmol/L (ref 3.5–5.2)
Sodium: 138 mmol/L (ref 134–144)
Total Protein: 7.5 g/dL (ref 6.0–8.5)

## 2019-08-21 ENCOUNTER — Other Ambulatory Visit: Payer: Self-pay | Admitting: Family Medicine

## 2019-09-19 ENCOUNTER — Other Ambulatory Visit: Payer: Self-pay | Admitting: Family Medicine

## 2019-10-09 ENCOUNTER — Encounter: Payer: Self-pay | Admitting: Family Medicine

## 2019-10-09 ENCOUNTER — Other Ambulatory Visit (HOSPITAL_COMMUNITY)
Admission: RE | Admit: 2019-10-09 | Discharge: 2019-10-09 | Disposition: A | Discharge status: Home / Self Care | Payer: 59 | Source: Ambulatory Visit | Attending: Family Medicine | Admitting: Family Medicine

## 2019-10-09 ENCOUNTER — Ambulatory Visit (INDEPENDENT_AMBULATORY_CARE_PROVIDER_SITE_OTHER): Payer: 59 | Admitting: Family Medicine

## 2019-10-09 ENCOUNTER — Other Ambulatory Visit: Payer: Self-pay

## 2019-10-09 VITALS — BP 120/80 | HR 102 | Temp 97.7°F | Ht 68.5 in | Wt 224.8 lb

## 2019-10-09 DIAGNOSIS — Z1231 Encounter for screening mammogram for malignant neoplasm of breast: Secondary | ICD-10-CM | POA: Diagnosis not present

## 2019-10-09 DIAGNOSIS — Z23 Encounter for immunization: Secondary | ICD-10-CM

## 2019-10-09 DIAGNOSIS — Z0000 Encounter for general adult medical examination without abnormal findings: Secondary | ICD-10-CM

## 2019-10-09 DIAGNOSIS — I10 Essential (primary) hypertension: Secondary | ICD-10-CM

## 2019-10-09 DIAGNOSIS — Z124 Encounter for screening for malignant neoplasm of cervix: Secondary | ICD-10-CM

## 2019-10-09 DIAGNOSIS — Z13220 Encounter for screening for lipoid disorders: Secondary | ICD-10-CM

## 2019-10-09 NOTE — Patient Instructions (Addendum)
Your blood pressure is in goal range.  Continue to monitor this.  If you are seeing readings consistently higher than 130/80, let me know.  Call and schedule your mammogram at the breast center as discussed.  The order is in the computer.  Check with your insurance regarding a screening colonoscopy.  If this is covered then let me know and I will refer you to the gastroenterologist.  It is recommended that you get at least 150 minutes of physical activity.  Walking is good for this.  I recommend that you stop smoking.  Find something healthier to do when you feel stressed.  We will contact you with your results.   Preventive Care 43-60 Years Old, Female Preventive care refers to visits with your health care provider and lifestyle choices that can promote health and wellness. This includes:  A yearly physical exam. This may also be called an annual well check.  Regular dental visits and eye exams.  Immunizations.  Screening for certain conditions.  Healthy lifestyle choices, such as eating a healthy diet, getting regular exercise, not using drugs or products that contain nicotine and tobacco, and limiting alcohol use. What can I expect for my preventive care visit? Physical exam Your health care provider will check your:  Height and weight. This may be used to calculate body mass index (BMI), which tells if you are at a healthy weight.  Heart rate and blood pressure.  Skin for abnormal spots. Counseling Your health care provider may ask you questions about your:  Alcohol, tobacco, and drug use.  Emotional well-being.  Home and relationship well-being.  Sexual activity.  Eating habits.  Work and work Statistician.  Method of birth control.  Menstrual cycle.  Pregnancy history. What immunizations do I need?  Influenza (flu) vaccine  This is recommended every year. Tetanus, diphtheria, and pertussis (Tdap) vaccine  You may need a Td booster every 10 years.  Varicella (chickenpox) vaccine  You may need this if you have not been vaccinated. Zoster (shingles) vaccine  You may need this after age 64. Measles, mumps, and rubella (MMR) vaccine  You may need at least one dose of MMR if you were born in 1957 or later. You may also need a second dose. Pneumococcal conjugate (PCV13) vaccine  You may need this if you have certain conditions and were not previously vaccinated. Pneumococcal polysaccharide (PPSV23) vaccine  You may need one or two doses if you smoke cigarettes or if you have certain conditions. Meningococcal conjugate (MenACWY) vaccine  You may need this if you have certain conditions. Hepatitis A vaccine  You may need this if you have certain conditions or if you travel or work in places where you may be exposed to hepatitis A. Hepatitis B vaccine  You may need this if you have certain conditions or if you travel or work in places where you may be exposed to hepatitis B. Haemophilus influenzae type b (Hib) vaccine  You may need this if you have certain conditions. Human papillomavirus (HPV) vaccine  If recommended by your health care provider, you may need three doses over 6 months. You may receive vaccines as individual doses or as more than one vaccine together in one shot (combination vaccines). Talk with your health care provider about the risks and benefits of combination vaccines. What tests do I need? Blood tests  Lipid and cholesterol levels. These may be checked every 5 years, or more frequently if you are over 68 years old.  Hepatitis C  test.  Hepatitis B test. Screening  Lung cancer screening. You may have this screening every year starting at age 83 if you have a 30-pack-year history of smoking and currently smoke or have quit within the past 15 years.  Colorectal cancer screening. All adults should have this screening starting at age 5 and continuing until age 65. Your health care provider may recommend  screening at age 7 if you are at increased risk. You will have tests every 1-10 years, depending on your results and the type of screening test.  Diabetes screening. This is done by checking your blood sugar (glucose) after you have not eaten for a while (fasting). You may have this done every 1-3 years.  Mammogram. This may be done every 1-2 years. Talk with your health care provider about when you should start having regular mammograms. This may depend on whether you have a family history of breast cancer.  BRCA-related cancer screening. This may be done if you have a family history of breast, ovarian, tubal, or peritoneal cancers.  Pelvic exam and Pap test. This may be done every 3 years starting at age 51. Starting at age 79, this may be done every 5 years if you have a Pap test in combination with an HPV test. Other tests  Sexually transmitted disease (STD) testing.  Bone density scan. This is done to screen for osteoporosis. You may have this scan if you are at high risk for osteoporosis. Follow these instructions at home: Eating and drinking  Eat a diet that includes fresh fruits and vegetables, whole grains, lean protein, and low-fat dairy.  Take vitamin and mineral supplements as recommended by your health care provider.  Do not drink alcohol if: ? Your health care provider tells you not to drink. ? You are pregnant, may be pregnant, or are planning to become pregnant.  If you drink alcohol: ? Limit how much you have to 0-1 drink a day. ? Be aware of how much alcohol is in your drink. In the [place] one drink equals one 12 oz bottle of beer (355 mL), one 5 oz glass of wine (148 mL), or one 1 oz glass of hard liquor (44 mL). Lifestyle  Take daily care of your teeth and gums.  Stay active. Exercise for at least 30 minutes on 5 or more days each week.  Do not use any products that contain nicotine or tobacco, such as cigarettes, e-cigarettes, and chewing tobacco. If you need  help quitting, ask your health care provider.  If you are sexually active, practice safe sex. Use a condom or other form of birth control (contraception) in order to prevent pregnancy and STIs (sexually transmitted infections).  If told by your health care provider, take low-dose aspirin daily starting at age 46. What's next?  Visit your health care provider once a year for a well check visit.  Ask your health care provider how often you should have your eyes and teeth checked.  Stay up to date on all vaccines. This information is not intended to replace advice given to you by your health care provider. Make sure you discuss any questions you have with your health care provider. Document Released: 01/03/2016 Document Revised: 08/18/2018 Document Reviewed: 08/18/2018 Elsevier Patient Education  2020 [employer].

## 2019-10-09 NOTE — Progress Notes (Signed)
Subjective:    Patient ID: Alicia Fry, female    DOB: Apr 26, 1971, 48 y.o.   MRN: [id number]  HPI Chief Complaint  Patient presents with  . fasting cpe    fasting cpe, pap, declines flu shot   She is here for a complete physical exam and to follow up on chronic health conditions.  Previous medical care: No regular medical care in years due to lack of health insurance per patient Last CPE: Many years ago  HTN-reports taking HCTZ daily without any issues.  She checks her blood pressure at home and states her blood pressures are in goal range.  Social history: Lives with some of her younger children.  States she has 5 children and several grandchildren. Denies drinking alcohol, drug use  States she smokes some days.  States she smokes when she is stressed. Diet: fairly healthy  Excerise: not often   Immunizations: Declines flu shot.  Tdap unknown, perhaps 2012  Health maintenance:  Mammogram: never  Colonoscopy: never  Last Gynecological Exam: 2012  Denies being sexually active for several months. Last Menstrual cycle: 10/02/2019 Last Dental Exam: is scheduled  Last Eye Exam: overdue   Wears seatbelt always, smoke detectors in home and functioning, does not text while driving and feels safe in home environment.   Reviewed allergies, medications, past medical, surgical, family, and social history.   Review of Systems Review of Systems Constitutional: -fever, -chills, -sweats, -unexpected weight change,-fatigue ENT: -runny nose, -ear pain, -sore throat Cardiology:  -chest pain, -palpitations, -edema Respiratory: -cough, -shortness of breath, -wheezing Gastroenterology: -abdominal pain, -nausea, -vomiting, -diarrhea, -constipation  Hematology: -bleeding or bruising problems Musculoskeletal: -arthralgias, -myalgias, -joint swelling, -back pain Ophthalmology: -vision changes Urology: -dysuria, -difficulty urinating, -hematuria, -urinary frequency, -urgency Neurology:  -headache, -weakness, -tingling, -numbness       Objective:   Physical Exam BP 120/80   Pulse (!) 102   Temp 97.7 F (36.5 C)   Ht 5' 8.5" (1.74 m)   Wt 224 lb 12.8 oz (102 kg)   LMP 10/02/2019   BMI 33.68 kg/m   General Appearance:    Alert, cooperative, no distress, appears stated age  Head:    Normocephalic, without obvious abnormality, atraumatic  Eyes:    PERRL, conjunctiva/corneas clear, EOM's intact  Ears:    Normal TM's and external ear canals  Nose:  Mask in place  Throat:  Mask in place  Neck:   Supple, no lymphadenopathy;  thyroid:  no   enlargement/tenderness/nodules  Back:    Spine nontender, no curvature, ROM normal, no CVA     tenderness  Lungs:     Clear to auscultation bilaterally without wheezes, rales or     ronchi; respirations unlabored  Chest Wall:    No tenderness or deformity   Heart:    Regular rate and rhythm, S1 and S2 normal, no murmur, rub   or gallop  Breast Exam:   Declines.  Mammogram ordered  Abdomen:     Soft, non-tender, nondistended, normoactive bowel sounds,    no masses, no hepatosplenomegaly  Genitalia:    Normal external genitalia without lesions.  BUS and vagina normal; cervix without lesions, or cervical motion tenderness. No abnormal vaginal discharge.  Uterus and adnexa not enlarged, nontender, no masses.  Pap performed.  Chaperone present     Extremities:   No clubbing, cyanosis or edema  Pulses:   2+ and symmetric all extremities  Skin:   Skin color, texture, turgor normal, no rashes or lesions  Lymph nodes:   Cervical, supraclavicular, and axillary nodes normal  Neurologic:   CNII-XII intact, normal strength, sensation and gait; reflexes 2+ and symmetric throughout          Psych:   Normal mood, affect, hygiene and grooming.        Assessment & Plan:  Routine general medical examination at a health care facility - Plan: Lipid panel -Here today for fasting CPE.  Discussed preventive healthcare and she is overdue.  Mammogram  ordered.  Pap smear done in the office today.  She will check with her insurance regarding whether a screening colonoscopy is covered.  Discussed with the guidelines now recommend the first screening colonoscopy after age 88.  Recommend annual eye exams and dental exams. Reviewed immunizations.  Tdap updated today.  Declines flu shot.  Discussed safety.  Essential hypertension -Blood pressure is in goal range.  She appears to be doing well on her current medication and she will continue.  Encouraged low-sodium diet and increase physical activity.  She will let me know if her blood pressure is not consistently in goal range.  Encounter for screening mammogram for malignant neoplasm of breast - Plan: MM DIGITAL SCREENING BILATERAL -She will call and schedule her mammogram  Screening for cervical cancer - Plan: Cytology - PAP([facility]) -Chaperone present.  She tolerated this well.  Declines STD testing.  Follow-up pending results.  Need for diphtheria-tetanus-pertussis (Tdap) vaccine - Plan: Tdap vaccine greater than or equal to 7yo IM -Counseling done and all components of the vaccine.  Screening for lipid disorders - Plan: Lipid panel -Follow-up pending results

## 2019-10-10 LAB — LIPID PANEL
Chol/HDL Ratio: 4.8 ratio — ABNORMAL HIGH (ref 0.0–4.4)
Cholesterol, Total: 188 mg/dL (ref 100–199)
HDL: 39 mg/dL — ABNORMAL LOW (ref >39)
LDL Chol Calc (NIH): 139 mg/dL — ABNORMAL HIGH (ref 0–99)
Triglycerides: 54 mg/dL (ref 0–149)
VLDL Cholesterol Cal: 10 mg/dL (ref 5–40)

## 2019-10-12 LAB — CYTOLOGY - PAP
Comment: NEGATIVE
Diagnosis: NEGATIVE
High risk HPV: NEGATIVE

## 2019-12-25 ENCOUNTER — Other Ambulatory Visit: Payer: Self-pay | Admitting: Family Medicine

## 2020-07-16 ENCOUNTER — Other Ambulatory Visit: Payer: Self-pay | Admitting: Family Medicine

## 2020-10-09 ENCOUNTER — Encounter (HOSPITAL_COMMUNITY): Payer: Self-pay | Admitting: Emergency Medicine

## 2020-10-09 ENCOUNTER — Ambulatory Visit (HOSPITAL_COMMUNITY)
Admission: EM | Admit: 2020-10-09 | Discharge: 2020-10-09 | Disposition: A | Discharge status: Home / Self Care | Payer: 59 | Attending: Family Medicine | Admitting: Family Medicine

## 2020-10-09 ENCOUNTER — Other Ambulatory Visit: Payer: Self-pay

## 2020-10-09 DIAGNOSIS — I10 Essential (primary) hypertension: Secondary | ICD-10-CM

## 2020-10-09 DIAGNOSIS — Z3202 Encounter for pregnancy test, result negative: Secondary | ICD-10-CM

## 2020-10-09 DIAGNOSIS — R1030 Lower abdominal pain, unspecified: Secondary | ICD-10-CM

## 2020-10-09 DIAGNOSIS — N946 Dysmenorrhea, unspecified: Secondary | ICD-10-CM

## 2020-10-09 LAB — POCT URINALYSIS DIPSTICK, ED / UC
Bilirubin Urine: NEGATIVE
Glucose, UA: NEGATIVE mg/dL
Ketones, ur: NEGATIVE mg/dL
Leukocytes,Ua: NEGATIVE
Nitrite: NEGATIVE
Protein, ur: NEGATIVE mg/dL
Specific Gravity, Urine: 1.015 (ref 1.005–1.030)
Urobilinogen, UA: 0.2 mg/dL (ref 0.0–1.0)
pH: 7 (ref 5.0–8.0)

## 2020-10-09 LAB — POC URINE PREG, ED: Preg Test, Ur: NEGATIVE

## 2020-10-09 NOTE — Discharge Instructions (Signed)
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
## 2020-10-27 ENCOUNTER — Other Ambulatory Visit: Payer: Self-pay | Admitting: Family Medicine

## 2020-10-28 NOTE — Telephone Encounter (Signed)
Left message for pt to call to schedule cpe and then we can refill med until her appt

## 2020-12-03 ENCOUNTER — Telehealth: Payer: Self-pay | Admitting: Family Medicine

## 2020-12-03 ENCOUNTER — Telehealth: Payer: Self-pay | Admitting: Internal Medicine

## 2020-12-03 MED ORDER — HYDROCHLOROTHIAZIDE 25 MG PO TABS: 25.0000 mg | ORAL_TABLET | Freq: Every day | ORAL | 0 refills | Status: DC

## 2020-12-03 NOTE — Telephone Encounter (Signed)
done

## 2020-12-03 NOTE — Telephone Encounter (Signed)
Pt scheduled a cpe for jan the 5th she is requesting a refill for hydrochlorothiazide please send to the [id number] - [place], [place]

## 2020-12-03 NOTE — Telephone Encounter (Signed)
Left message for pt to call and schedule her cpe

## 2020-12-25 ENCOUNTER — Encounter: Payer: Self-pay | Admitting: Family Medicine

## 2020-12-25 ENCOUNTER — Ambulatory Visit (INDEPENDENT_AMBULATORY_CARE_PROVIDER_SITE_OTHER): Payer: Managed Care, Other (non HMO) | Admitting: Family Medicine

## 2020-12-25 ENCOUNTER — Other Ambulatory Visit: Payer: Self-pay

## 2020-12-25 VITALS — BP 148/96 | HR 80 | Resp 16 | Ht 68.5 in | Wt 217.6 lb

## 2020-12-25 DIAGNOSIS — I10 Essential (primary) hypertension: Secondary | ICD-10-CM | POA: Diagnosis not present

## 2020-12-25 DIAGNOSIS — Z23 Encounter for immunization: Secondary | ICD-10-CM | POA: Diagnosis not present

## 2020-12-25 DIAGNOSIS — Z1239 Encounter for other screening for malignant neoplasm of breast: Secondary | ICD-10-CM | POA: Diagnosis not present

## 2020-12-25 DIAGNOSIS — F17200 Nicotine dependence, unspecified, uncomplicated: Secondary | ICD-10-CM | POA: Diagnosis not present

## 2020-12-25 DIAGNOSIS — Z13220 Encounter for screening for lipoid disorders: Secondary | ICD-10-CM

## 2020-12-25 DIAGNOSIS — Z5320 Procedure and treatment not carried out because of patient's decision for unspecified reasons: Secondary | ICD-10-CM

## 2020-12-25 DIAGNOSIS — F439 Reaction to severe stress, unspecified: Secondary | ICD-10-CM | POA: Diagnosis not present

## 2020-12-25 DIAGNOSIS — Z1211 Encounter for screening for malignant neoplasm of colon: Secondary | ICD-10-CM

## 2020-12-25 DIAGNOSIS — Z1159 Encounter for screening for other viral diseases: Secondary | ICD-10-CM

## 2020-12-25 DIAGNOSIS — Z0000 Encounter for general adult medical examination without abnormal findings: Secondary | ICD-10-CM

## 2020-12-25 DIAGNOSIS — E669 Obesity, unspecified: Secondary | ICD-10-CM

## 2020-12-25 HISTORY — DX: Obesity, unspecified: E66.9

## 2020-12-25 LAB — CBC WITH DIFFERENTIAL/PLATELET
EOS (ABSOLUTE): 0.1 10*3/uL (ref 0.0–0.4)
Hematocrit: 38 % (ref 34.0–46.6)
Immature Grans (Abs): 0 10*3/uL (ref 0.0–0.1)
Lymphocytes Absolute: 2.4 10*3/uL (ref 0.7–3.1)
RDW: 12.9 % (ref 11.7–15.4)

## 2020-12-25 LAB — COMPREHENSIVE METABOLIC PANEL
Albumin/Globulin Ratio: 1.3 (ref 1.2–2.2)
BUN/Creatinine Ratio: 8 — ABNORMAL LOW (ref 9–23)
Bilirubin Total: 0.6 mg/dL (ref 0.0–1.2)
Creatinine, Ser: 1 mg/dL (ref 0.57–1.00)
Sodium: 138 mmol/L (ref 134–144)

## 2020-12-25 LAB — POCT URINALYSIS DIP (PROADVANTAGE DEVICE)
Bilirubin, UA: NEGATIVE
Blood, UA: NEGATIVE
Glucose, UA: NEGATIVE mg/dL
Ketones, POC UA: NEGATIVE mg/dL
Leukocytes, UA: NEGATIVE
Nitrite, UA: NEGATIVE
Protein Ur, POC: NEGATIVE mg/dL
Specific Gravity, Urine: 1.02
Urobilinogen, Ur: NEGATIVE
pH, UA: 6.5 (ref 5.0–8.0)

## 2020-12-25 LAB — LIPID PANEL
HDL: 42 mg/dL (ref >39)
Triglycerides: 73 mg/dL (ref 0–149)

## 2020-12-25 MED ORDER — HYDROCHLOROTHIAZIDE 25 MG PO TABS: 25.0000 mg | ORAL_TABLET | Freq: Every day | ORAL | 2 refills | Status: DC

## 2020-12-25 NOTE — Progress Notes (Signed)
Subjective:    Patient ID: Alicia Fry, female    DOB: 07-20-1971, 50 y.o.   MRN: [id number]  HPI She is here for a CPE and to follow up on chronic health conditions including hypertension. Previous medical care: states she has not had health insurance over the past year.  Last CPE:09/2019  HTN-states her BP at home has been in a good range however, has been elevated for the past month due to being out of her medication. States she has been eating a lot of potato chips lately. Her mother cooks with salt.   Smoking cigarettes but does not need them when she is at work.  States she smokes more on the weekends.  States this is a stress choice for her.  Drank alcohol last night but states she rarely drinks.   Right lower tooth that is being pulled later today.   Social history: Lives with her mom and 5 children, works for [employer].  Diet: states she eats a lot of greens, pinto beans, rice and not as much meat.  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   Immunizations: [employer] 09/04/2020 at [facility]. Needs 2nd Covid shot.   Health maintenance:  Mammogram:  2 years ago  Colonoscopy: never  Last Gynecological Exam: last year  Last Menstrual cycle: regular and light. Last week  Last Dental Exam: getting a tooth pulled today.  Last Eye Exam: in the past few weeks   Wears seatbelt always, smoke detectors in home and functioning, does not text while driving and feels safe in home environment.   Reviewed allergies, medications, past medical, surgical, family, and social history.    Review of Systems Review of Systems Constitutional: -fever, -chills, -sweats, -unexpected weight change,-fatigue ENT: -runny nose, -ear pain, -sore throat Cardiology:  -chest pain, -palpitations, -edema Respiratory: -cough, -shortness of breath, -wheezing Gastroenterology: -abdominal pain, -nausea, -vomiting, -diarrhea, -constipation  Hematology: -bleeding or bruising  problems Musculoskeletal: -arthralgias, -myalgias, -joint swelling, -back pain Ophthalmology: -vision changes Urology: -dysuria, -difficulty urinating, -hematuria, -urinary frequency, -urgency Neurology: -headache, -weakness, -tingling, -numbness       Objective:   Physical Exam BP (!) 148/96   Pulse 80   Resp 16   Ht 5' 8.5" (1.74 m)   Wt 217 lb 9.6 oz (98.7 kg)   LMP 12/17/2020 (Exact Date)   BMI 32.60 kg/m   General Appearance:    Alert, cooperative, no distress, appears stated age  Head:    Normocephalic, without obvious abnormality, atraumatic  Eyes:    PERRL, conjunctiva/corneas clear, EOM's intact  Ears:    Normal TM's and external ear canals  Nose:   Mask on   Throat:   Mask on   Neck:   Supple, no lymphadenopathy;  thyroid:  no   enlargement/tenderness/nodules; no JVD  Back:    Spine nontender, no curvature, ROM normal, no CVA     tenderness  Lungs:     Clear to auscultation bilaterally without wheezes, rales or     ronchi; respirations unlabored  Chest Wall:    No tenderness or deformity   Heart:    Regular rate and rhythm, S1 and S2 normal, no murmur, rub   or gallop  Breast Exam:    Declines   Abdomen:     Soft, non-tender, nondistended, normoactive bowel sounds,    no masses, no hepatosplenomegaly  Genitalia:    Declines. Pap UTD per patient      Extremities:   No clubbing,  cyanosis or edema  Pulses:   2+ and symmetric all extremities  Skin:   Skin color, texture, turgor normal, no rashes or lesions  Lymph nodes:   Cervical, supraclavicular, and axillary nodes normal  Neurologic:   CNII-XII intact, normal strength, sensation and gait; reflexes 2+ and symmetric throughout          Psych:   Normal mood, affect, hygiene and grooming.         Assessment & Plan:  Annual physical exam - Plan: POCT Urinalysis DIP (Proadvantage Device), CBC with Differential/Platelet, Comprehensive metabolic panel, TSH, T4, free, Lipid panel -She is here for fasting CPE.   Preventive health care reviewed and she is overdue in several areas.  Referral to GI.  Mammogram ordered and she will call to schedule.  She gets [facility] at [facility] and is up-to-date.  Counseling on healthy lifestyle including diet, exercise and smoking cessation.  Immunizations reviewed and updated.   Primary hypertension - Plan: CBC with Differential/Platelet, Comprehensive metabolic panel, hydrochlorothiazide (HYDRODIURIL) 25 MG tablet -Discussed good medication compliance and refilled HCTZ.  Counseling on low-sodium diet.  She will keep an eye on her blood pressure over the next 4 weeks and return for follow-up with her blood pressure machine and her readings.  Obesity (BMI 30-39.9) - Plan: TSH, T4, free, Lipid panel -Counseling on healthy diet and exercise for weight loss  Stress -Encouraged finding healthier ways to manage stress  Smoker -Counseling on smoking cessation including finding healthier habit to replace cigarette smoking.  She may try over-the-counter NicoDerm or lozenges or gum.  Need for hepatitis C screening test - Plan: Hepatitis C antibody -Done per screening guidelines  HIV screening declined  Encounter for breast cancer screening using non-mammogram modality - Plan: MM DIGITAL SCREENING BILATERAL -Appears to be overdue.  She will call and schedule this  Screen for colon cancer - Plan: Ambulatory referral to Gastroenterology -This will be her first screening colonoscopy  Screening for lipid disorders - Plan: Lipid panel  Need for viral immunization - Plan: Pfizer SARS-COV-2 Vaccine -Counseling on potential side effects

## 2020-12-25 NOTE — Patient Instructions (Signed)
Start back on your blood pressure medication daily.  Keep an eye on your blood pressure at home.  Cut back on salty foods including potato chips.  Follow-up with me in 4 weeks and bring in your blood pressure machine and your readings.  Call and schedule mammogram but let them know that you received your second Covid vaccine today.       Preventive Care 58-49 Years Old, Female Preventive care refers to visits with your health care provider and lifestyle choices that can promote health and wellness. This includes:  A yearly physical exam. This may also be called an annual well check.  Regular dental visits and eye exams.  Immunizations.  Screening for certain conditions.  Healthy lifestyle choices, such as eating a healthy diet, getting regular exercise, not using drugs or products that contain nicotine and tobacco, and limiting alcohol use. What can I expect for my preventive care visit? Physical exam Your health care provider will check your:  Height and weight. This may be used to calculate body mass index (BMI), which tells if you are at a healthy weight.  Heart rate and blood pressure.  Skin for abnormal spots. Counseling Your health care provider may ask you questions about your:  Alcohol, tobacco, and drug use.  Emotional well-being.  Home and relationship well-being.  Sexual activity.  Eating habits.  Work and work Statistician.  Method of birth control.  Menstrual cycle.  Pregnancy history. What immunizations do I need?  Influenza (flu) vaccine  This is recommended every year. Tetanus, diphtheria, and pertussis (Tdap) vaccine  You may need a Td booster every 10 years. Varicella (chickenpox) vaccine  You may need this if you have not been vaccinated. Zoster (shingles) vaccine  You may need this after age 30. Measles, mumps, and rubella (MMR) vaccine  You may need at least one dose of MMR if you were born in 1957 or later. You may also need  a second dose. Pneumococcal conjugate (PCV13) vaccine  You may need this if you have certain conditions and were not previously vaccinated. Pneumococcal polysaccharide (PPSV23) vaccine  You may need one or two doses if you smoke cigarettes or if you have certain conditions. Meningococcal conjugate (MenACWY) vaccine  You may need this if you have certain conditions. Hepatitis A vaccine  You may need this if you have certain conditions or if you travel or work in places where you may be exposed to hepatitis A. Hepatitis B vaccine  You may need this if you have certain conditions or if you travel or work in places where you may be exposed to hepatitis B. Haemophilus influenzae type b (Hib) vaccine  You may need this if you have certain conditions. Human papillomavirus (HPV) vaccine  If recommended by your health care provider, you may need three doses over 6 months. You may receive vaccines as individual doses or as more than one vaccine together in one shot (combination vaccines). Talk with your health care provider about the risks and benefits of combination vaccines. What tests do I need? Blood tests  Lipid and cholesterol levels. These may be checked every 5 years, or more frequently if you are over 49 years old.  Hepatitis C test.  Hepatitis B test. Screening  Lung cancer screening. You may have this screening every year starting at age 8 if you have a 30-pack-year history of smoking and currently smoke or have quit within the past 15 years.  Colorectal cancer screening. All adults should have this screening  starting at age 72 and continuing until age 58. Your health care provider may recommend screening at age 65 if you are at increased risk. You will have tests every 1-10 years, depending on your results and the type of screening test.  Diabetes screening. This is done by checking your blood sugar (glucose) after you have not eaten for a while (fasting). You may have this  done every 1-3 years.  Mammogram. This may be done every 1-2 years. Talk with your health care provider about when you should start having regular mammograms. This may depend on whether you have a family history of breast cancer.  BRCA-related cancer screening. This may be done if you have a family history of breast, ovarian, tubal, or peritoneal cancers.  Pelvic exam and Pap test. This may be done every 3 years starting at age 59. Starting at age 36, this may be done every 5 years if you have a Pap test in combination with an HPV test. Other tests  Sexually transmitted disease (STD) testing.  Bone density scan. This is done to screen for osteoporosis. You may have this scan if you are at high risk for osteoporosis. Follow these instructions at home: Eating and drinking  Eat a diet that includes fresh fruits and vegetables, whole grains, lean protein, and low-fat dairy.  Take vitamin and mineral supplements as recommended by your health care provider.  Do not drink alcohol if: ? Your health care provider tells you not to drink. ? You are pregnant, may be pregnant, or are planning to become pregnant.  If you drink alcohol: ? Limit how much you have to 0-1 drink a day. ? Be aware of how much alcohol is in your drink. In the [place] one drink equals one 12 oz bottle of beer (355 mL), one 5 oz glass of wine (148 mL), or one 1 oz glass of hard liquor (44 mL). Lifestyle  Take daily care of your teeth and gums.  Stay active. Exercise for at least 30 minutes on 5 or more days each week.  Do not use any products that contain nicotine or tobacco, such as cigarettes, e-cigarettes, and chewing tobacco. If you need help quitting, ask your health care provider.  If you are sexually active, practice safe sex. Use a condom or other form of birth control (contraception) in order to prevent pregnancy and STIs (sexually transmitted infections).  If told by your health care provider, take low-dose  aspirin daily starting at age 77. What's next?  Visit your health care provider once a year for a well check visit.  Ask your health care provider how often you should have your eyes and teeth checked.  Stay up to date on all vaccines. This information is not intended to replace advice given to you by your health care provider. Make sure you discuss any questions you have with your health care provider. Document Revised: 08/18/2018 Document Reviewed: 08/18/2018 Elsevier Patient Education  2020 [employer].

## 2020-12-26 LAB — COMPREHENSIVE METABOLIC PANEL
ALT: 15 IU/L (ref 0–32)
AST: 17 IU/L (ref 0–40)
Albumin: 4.3 g/dL (ref 3.8–4.8)
Alkaline Phosphatase: 105 IU/L (ref 44–121)
BUN: 8 mg/dL (ref 6–24)
CO2: 21 mmol/L (ref 20–29)
Calcium: 9.7 mg/dL (ref 8.7–10.2)
Chloride: 99 mmol/L (ref 96–106)
GFR calc Af Amer: 77 mL/min/{1.73_m2} (ref >59)
GFR calc non Af Amer: 67 mL/min/{1.73_m2} (ref >59)
Globulin, Total: 3.3 g/dL (ref 1.5–4.5)
Glucose: 112 mg/dL — ABNORMAL HIGH (ref 65–99)
Potassium: 4.4 mmol/L (ref 3.5–5.2)
Total Protein: 7.6 g/dL (ref 6.0–8.5)

## 2020-12-26 LAB — CBC WITH DIFFERENTIAL/PLATELET
Basophils Absolute: 0 10*3/uL (ref 0.0–0.2)
Basos: 1 %
Eos: 2 %
Hemoglobin: 13 g/dL (ref 11.1–15.9)
Immature Granulocytes: 0 %
Lymphs: 39 %
MCH: 31.8 pg (ref 26.6–33.0)
MCHC: 34.2 g/dL (ref 31.5–35.7)
MCV: 93 fL (ref 79–97)
Monocytes Absolute: 0.4 10*3/uL (ref 0.1–0.9)
Monocytes: 6 %
Neutrophils Absolute: 3.3 10*3/uL (ref 1.4–7.0)
Neutrophils: 52 %
Platelets: 342 10*3/uL (ref 150–450)
RBC: 4.09 x10E6/uL (ref 3.77–5.28)
WBC: 6.2 10*3/uL (ref 3.4–10.8)

## 2020-12-26 LAB — LIPID PANEL
Chol/HDL Ratio: 4.8 ratio — ABNORMAL HIGH (ref 0.0–4.4)
Cholesterol, Total: 201 mg/dL — ABNORMAL HIGH (ref 100–199)
LDL Chol Calc (NIH): 146 mg/dL — ABNORMAL HIGH (ref 0–99)
VLDL Cholesterol Cal: 13 mg/dL (ref 5–40)

## 2020-12-26 LAB — T4, FREE: Free T4: 1.23 ng/dL (ref 0.82–1.77)

## 2020-12-26 LAB — TSH: TSH: 1.54 u[IU]/mL (ref 0.450–4.500)

## 2020-12-26 LAB — HEPATITIS C ANTIBODY: Hep C Virus Ab: <0.1 s/co ratio (ref 0.0–0.9)

## 2020-12-26 NOTE — Progress Notes (Signed)
Her fasting blood glucose is elevated. Please ask Alicia Fry to add a Hgb A1c if possible.

## 2020-12-27 NOTE — Progress Notes (Signed)
Her blood sugar was elevated so I screened her for diabetes and she in the prediabetes range with a Hgb A1c of 5.8% (prediabetes range is 5.7-6.4%) so I recommend cutting back some on her sugar and carbohydrate intake such as bread, pasta, rice, potatoes. Also, her LDL is slightly higher than last year so limit foods high in fat, fried foods and try to get at least 150 minutes of some form of exercise each week to help lower her cholesterol.

## 2021-01-17 LAB — HGB A1C W/O EAG: Hgb A1c MFr Bld: 5.8 % — ABNORMAL HIGH (ref 4.8–5.6)

## 2021-01-17 LAB — SPECIMEN STATUS REPORT

## 2021-01-22 ENCOUNTER — Ambulatory Visit: Payer: Managed Care, Other (non HMO) | Admitting: Family Medicine

## 2021-01-23 ENCOUNTER — Encounter: Payer: Self-pay | Admitting: Family Medicine

## 2021-07-06 ENCOUNTER — Other Ambulatory Visit: Payer: Self-pay | Admitting: Family Medicine

## 2021-07-06 DIAGNOSIS — I10 Essential (primary) hypertension: Secondary | ICD-10-CM

## 2021-09-23 ENCOUNTER — Other Ambulatory Visit: Payer: Self-pay | Admitting: Family Medicine

## 2021-09-23 DIAGNOSIS — I10 Essential (primary) hypertension: Secondary | ICD-10-CM

## 2021-12-16 ENCOUNTER — Other Ambulatory Visit: Payer: Self-pay

## 2021-12-16 DIAGNOSIS — I10 Essential (primary) hypertension: Secondary | ICD-10-CM

## 2021-12-16 MED ORDER — HYDROCHLOROTHIAZIDE 25 MG PO TABS: 25.0000 mg | ORAL_TABLET | Freq: Every day | ORAL | 1 refills | Status: DC

## 2022-01-01 ENCOUNTER — Encounter: Payer: Self-pay | Admitting: Family Medicine

## 2022-01-01 ENCOUNTER — Ambulatory Visit (INDEPENDENT_AMBULATORY_CARE_PROVIDER_SITE_OTHER): Payer: BC Managed Care – PPO | Admitting: Family Medicine

## 2022-01-01 ENCOUNTER — Other Ambulatory Visit: Payer: Self-pay

## 2022-01-01 VITALS — BP 178/110 | HR 97 | Temp 98.1°F | Wt 218.4 lb

## 2022-01-01 DIAGNOSIS — I10 Essential (primary) hypertension: Secondary | ICD-10-CM

## 2022-01-01 DIAGNOSIS — Z638 Other specified problems related to primary support group: Secondary | ICD-10-CM | POA: Diagnosis not present

## 2022-01-01 DIAGNOSIS — E669 Obesity, unspecified: Secondary | ICD-10-CM | POA: Diagnosis not present

## 2022-01-01 MED ORDER — LOSARTAN POTASSIUM-HCTZ 50-12.5 MG PO TABS: 1.0000 | ORAL_TABLET | Freq: Every day | ORAL | 1 refills | Status: DC

## 2022-01-01 NOTE — Progress Notes (Signed)
° °  Subjective:    Patient ID: Alicia Fry, female    DOB: 03/31/1971, 51 y.o.   MRN: [id number]  HPI She is here for consult concerning her blood pressure.  She has been taking HCTZ but did not return for follow-up visit.  She had some recent dental work which did show elevated blood pressure.  She states that she is getting ready to join a gym. At the end of the encounter she then mentions at that she is crying easily.  Further discussion with her indicates that her grown children are still relying upon her to help with various issues including finance.  She apparently is still paying their own and car insurance as well as helping out another regards. Review of Systems     Objective:   Physical Exam  Alert and in no distress.  Blood pressure is recorded      Assessment & Plan:  Primary hypertension - Plan: losartan-hydrochlorothiazide (HYZAAR) 50-12.5 MG tablet  Obesity (BMI 30-39.9)  Stress due to family tension Discussed continuing to go to the gym on a regular basis.  Then discussed weight loss in regard to cutting back on carbohydrates.  She is also to bring in her blood pressure cuff with her next visit which will be in February. I then discussed the fact that it is time for the children to become more responsible for their own wellbeing in regards to paying their own bills etc.  Explained that she has enabled them to not grow up in that regard.  She is to discuss this with her children and come up with a game plan on them taking over more and more further on care and wellbeing and not relying upon her.

## 2022-01-01 NOTE — Patient Instructions (Signed)
Take your medicine daily.  Usual gym membership.  Bring your blood pressure cuff in with you when you come in the next time

## 2022-01-07 ENCOUNTER — Telehealth: Payer: Self-pay | Admitting: Family Medicine

## 2022-01-07 NOTE — Telephone Encounter (Signed)
Left message for pt to call. We need to know why pt is requesting FMLA.

## 2022-01-07 NOTE — Telephone Encounter (Signed)
Pt called back concerning FMLA form from [place] we received. Pt states that she did not ask for FMLA but she has been out of work for multiple concerns. She states she went to dentist to have a procedure done and they would not do due to bp being high. She came to see [place] and was given a new bp meds and her bp is much better and is going to see dentist to have procedure tomorrow and will then return to work. Pt was advise that form will not be completed by [place] as [employer] did not take pt out of work.

## 2022-01-07 NOTE — Telephone Encounter (Signed)
Received a disability and leave provider statement for this previous Alicia Fry pt. She was last seen by [facility] 0112/2023. Sending form back to be completed. Please return to [facility] as records need to be sent with completed form.

## 2022-01-19 NOTE — Telephone Encounter (Signed)
Pre J[facility] we are not filling out the [facility]

## 2022-02-09 ENCOUNTER — Encounter: Payer: Managed Care, Other (non HMO) | Admitting: Physician Assistant

## 2022-02-09 NOTE — Progress Notes (Deleted)
Established Patient Office Visit  Subjective:  Patient ID: Alicia Fry, female    DOB: Jan 08, 1971  Age: 51 y.o. MRN: [id number]  CC: No chief complaint on file.   HPI Alicia Fry presents for ***  Past Medical History:  Diagnosis Date   Hypertension     Past Surgical History:  Procedure Laterality Date   LASIK      Family History  Problem Relation Age of Onset   Diabetes Mother    Hypertension Father     Social History   Socioeconomic History   Marital status: Single    Spouse name: Not on file   Number of children: Not on file   Years of education: Not on file   Highest education level: Not on file  Occupational History   Not on file  Tobacco Use   Smoking status: Every Day    Packs/day: 0.25    Years: 17.00    Pack years: 4.25    Types: Cigarettes    Last attempt to quit: 06/08/2019    Years since quitting: 2.6   Smokeless tobacco: Never  Substance and Sexual Activity   Alcohol use: Yes    Comment: rare    Drug use: No   Sexual activity: Yes    Birth control/protection: Condom  Other Topics Concern   Not on file  Social History Narrative   Not on file   Social Determinants of Health   Financial Resource Strain: Not on file  Food Insecurity: Not on file  Transportation Needs: Not on file  Physical Activity: Not on file  Stress: Not on file  Social Connections: Not on file  Intimate Partner Violence: Not on file    Outpatient Medications Prior to Visit  Medication Sig Dispense Refill   losartan-hydrochlorothiazide (HYZAAR) 50-12.5 MG tablet Take 1 tablet by mouth daily. 90 tablet 1   Multiple Vitamin (MULTIVITAMIN) tablet Take 1 tablet by mouth daily.     No facility-administered medications prior to visit.    No Known Allergies  ROS Review of Systems    Objective:    Physical Exam  There were no vitals taken for this visit. Wt Readings from Last 3 Encounters:  01/01/22 218 lb 6.4 oz (99.1 kg)  12/25/20 217 lb 9.6 oz  (98.7 kg)  10/09/19 224 lb 12.8 oz (102 kg)     Health Maintenance Due  Topic Date Due   HIV Screening  Never done   COLONOSCOPY (Pts 45-72yrs Insurance coverage will need to be confirmed)  Never done   COVID-19 Vaccine (3 - Booster for [place] series) 02/19/2021   INFLUENZA VACCINE  Never done    There are no preventive care reminders to display for this patient.  Lab Results  Component Value Date   TSH 1.540 12/25/2020   Lab Results  Component Value Date   WBC 6.2 12/25/2020   HGB 13.0 12/25/2020   HCT 38.0 12/25/2020   MCV 93 12/25/2020   PLT 342 12/25/2020   Lab Results  Component Value Date   NA 138 12/25/2020   K 4.4 12/25/2020   CO2 21 12/25/2020   GLUCOSE 112 (H) 12/25/2020   BUN 8 12/25/2020   CREATININE 1.00 12/25/2020   BILITOT 0.6 12/25/2020   ALKPHOS 105 12/25/2020   AST 17 12/25/2020   ALT 15 12/25/2020   PROT 7.6 12/25/2020   ALBUMIN 4.3 12/25/2020   CALCIUM 9.7 12/25/2020   Lab Results  Component Value Date   CHOL 201 (H)  12/25/2020   Lab Results  Component Value Date   HDL 42 12/25/2020   Lab Results  Component Value Date   LDLCALC 146 (H) 12/25/2020   Lab Results  Component Value Date   TRIG 73 12/25/2020   Lab Results  Component Value Date   CHOLHDL 4.8 (H) 12/25/2020   Lab Results  Component Value Date   HGBA1C 5.8 (H) 12/25/2020      Assessment & Plan:   Problem List Items Addressed This Visit   None   No orders of the defined types were placed in this encounter.   Follow-up: No follow-ups on file.    Irene Pap, PA-C

## 2022-03-30 ENCOUNTER — Encounter: Payer: Self-pay | Admitting: Physician Assistant

## 2022-03-31 ENCOUNTER — Encounter: Payer: Self-pay | Admitting: Physician Assistant

## 2022-05-01 ENCOUNTER — Encounter (HOSPITAL_COMMUNITY): Payer: Self-pay

## 2022-05-01 ENCOUNTER — Emergency Department (HOSPITAL_COMMUNITY)
Admission: EM | Admit: 2022-05-01 | Discharge: 2022-05-01 | Disposition: A | Discharge status: Home / Self Care | Payer: Commercial Managed Care - HMO | Attending: Emergency Medicine | Admitting: Emergency Medicine

## 2022-05-01 ENCOUNTER — Other Ambulatory Visit (HOSPITAL_COMMUNITY): Payer: Self-pay | Admitting: Radiology

## 2022-05-01 ENCOUNTER — Other Ambulatory Visit: Payer: Self-pay

## 2022-05-01 ENCOUNTER — Emergency Department (HOSPITAL_COMMUNITY): Payer: Commercial Managed Care - HMO

## 2022-05-01 DIAGNOSIS — N939 Abnormal uterine and vaginal bleeding, unspecified: Secondary | ICD-10-CM

## 2022-05-01 DIAGNOSIS — D259 Leiomyoma of uterus, unspecified: Secondary | ICD-10-CM | POA: Insufficient documentation

## 2022-05-01 DIAGNOSIS — D219 Benign neoplasm of connective and other soft tissue, unspecified: Secondary | ICD-10-CM

## 2022-05-01 LAB — CBC
HCT: 35.6 % — ABNORMAL LOW (ref 36.0–46.0)
Hemoglobin: 12 g/dL (ref 12.0–15.0)
MCH: 33.1 pg (ref 26.0–34.0)
MCHC: 33.7 g/dL (ref 30.0–36.0)
MCV: 98.3 fL (ref 80.0–100.0)
Platelets: 218 10*3/uL (ref 150–400)
RBC: 3.62 MIL/uL — ABNORMAL LOW (ref 3.87–5.11)
RDW: 12.6 % (ref 11.5–15.5)
WBC: 5 10*3/uL (ref 4.0–10.5)
nRBC: 0 % (ref 0.0–0.2)

## 2022-05-01 LAB — WET PREP, GENITAL
Clue Cells Wet Prep HPF POC: NONE SEEN
Sperm: NONE SEEN
Trich, Wet Prep: NONE SEEN
WBC, Wet Prep HPF POC: <10 (ref <10)
Yeast Wet Prep HPF POC: NONE SEEN

## 2022-05-01 LAB — I-STAT BETA HCG BLOOD, ED (MC, WL, AP ONLY): I-stat hCG, quantitative: <5 m[IU]/mL (ref <5)

## 2022-05-01 LAB — BASIC METABOLIC PANEL
Anion gap: 7 (ref 5–15)
BUN: 14 mg/dL (ref 6–20)
CO2: 24 mmol/L (ref 22–32)
Calcium: 8.9 mg/dL (ref 8.9–10.3)
Chloride: 106 mmol/L (ref 98–111)
Creatinine, Ser: 1.02 mg/dL — ABNORMAL HIGH (ref 0.44–1.00)
GFR, Estimated: >60 mL/min (ref >60)
Glucose, Bld: 123 mg/dL — ABNORMAL HIGH (ref 70–99)
Potassium: 3.6 mmol/L (ref 3.5–5.1)
Sodium: 137 mmol/L (ref 135–145)

## 2022-05-01 MED ORDER — MEDROXYPROGESTERONE ACETATE 10 MG PO TABS: 10.0000 mg | ORAL_TABLET | Freq: Two times a day (BID) | ORAL | 0 refills | Status: DC

## 2022-05-01 MED ORDER — LOSARTAN POTASSIUM 50 MG PO TABS
50.0000 mg | ORAL_TABLET | Freq: Once | ORAL | Status: AC
  Administered 2022-05-01: 50 mg via ORAL
  Filled 2022-05-01: qty 1

## 2022-05-01 MED ORDER — HYDROCHLOROTHIAZIDE 25 MG PO TABS
25.0000 mg | ORAL_TABLET | Freq: Once | ORAL | Status: AC
  Administered 2022-05-01: 25 mg via ORAL
  Filled 2022-05-01: qty 1

## 2022-05-01 NOTE — ED Provider Notes (Signed)
?[place] ?Provider Note ? ? ?CSN: [id number] ?Arrival date & time: 05/01/22  0731 ? ?  ? ?History ? ?Chief Complaint  ?Patient presents with  ? Vaginal Bleeding  ? ? ?Alicia Fry is a 51 y.o. female. ? ?Patient presents with irregular bleeding for the past 10 days.  States she is her menses did not come on in April and came on several weeks early in May.  She has been bleeding since May 2 approximately 2-3 poise pads daily.  Contrary to triage note she is not soaking more than 1 pad an hour.  States she has had heavy clots and increased bleeding that has been irregular and lasting longer than usual.  Denies possibility of pregnancy.  Denies significant pain.  No dizziness or lightheadedness.  No chest pain or shortness of breath.  No fever or vomiting.  Believes she is starting menopause. ?Does not have a PCP or gynecologist ? ?The history is provided by the patient.  ?Vaginal Bleeding ?Associated symptoms: no abdominal pain, no dizziness, no dysuria, no fever and no nausea   ? ?  ? ?Home Medications ?Prior to Admission medications   ?Medication Sig Start Date End Date Taking? Authorizing Provider  ?losartan-hydrochlorothiazide (HYZAAR) 50-12.5 MG tablet Take 1 tablet by mouth daily. 01/01/22   Denita Lung, MD  ?Multiple Vitamin (MULTIVITAMIN) tablet Take 1 tablet by mouth daily.    [provider]  ?   ? ?Allergies    ?Patient has no known allergies.   ? ?Review of Systems   ?Review of Systems  ?Constitutional:  Negative for activity change, appetite change and fever.  ?HENT:  Negative for congestion.   ?Respiratory:  Negative for cough, chest tightness and shortness of breath.   ?Cardiovascular:  Negative for chest pain.  ?Gastrointestinal:  Negative for abdominal pain, nausea and vomiting.  ?Genitourinary:  Positive for vaginal bleeding. Negative for dysuria and frequency.  ?Musculoskeletal:  Negative for arthralgias and myalgias.  ?Skin:  Negative for  rash.  ?Neurological:  Negative for dizziness, weakness and headaches.  ? all other systems are negative except as noted in the HPI and PMH.  ? ?Physical Exam ?Updated Vital Signs ?BP (!) 155/113 (BP Location: Left Arm)   Pulse 80   Temp 98.1 ?F (36.7 ?C) (Oral)   Resp 17   Ht '5\' 8"'$  (1.727 m)   Wt 99.8 kg   SpO2 100%   BMI 33.45 kg/m?  ?Physical Exam ?Vitals and nursing note reviewed.  ?Constitutional:   ?   General: She is not in acute distress. ?   Appearance: She is well-developed.  ?HENT:  ?   Head: Normocephalic and atraumatic.  ?   Mouth/Throat:  ?   Pharynx: No oropharyngeal exudate.  ?Eyes:  ?   Conjunctiva/sclera: Conjunctivae normal.  ?   Pupils: Pupils are equal, round, and reactive to light.  ?Neck:  ?   Comments: No meningismus. ?Cardiovascular:  ?   Rate and Rhythm: Normal rate and regular rhythm.  ?   Heart sounds: Normal heart sounds. No murmur heard. ?Pulmonary:  ?   Effort: Pulmonary effort is normal. No respiratory distress.  ?   Breath sounds: Normal breath sounds.  ?Chest:  ?   Chest wall: No tenderness.  ?Abdominal:  ?   Palpations: Abdomen is soft.  ?   Tenderness: There is no abdominal tenderness. There is no guarding or rebound.  ?Genitourinary: ?   Comments: Chaperone present.  Normal external  genitalia.  Dark blood in vaginal vault.  Cervix is mildly dilated.  Some clots expressed.  No CMT or adnexal tenderness ?Musculoskeletal:     ?   General: No tenderness. Normal range of motion.  ?   Cervical back: Normal range of motion and neck supple.  ?Skin: ?   General: Skin is warm.  ?Neurological:  ?   Mental Status: She is alert and oriented to person, place, and time.  ?   Cranial Nerves: No cranial nerve deficit.  ?   Motor: No abnormal muscle tone.  ?   Coordination: Coordination normal.  ?   Comments:  5/5 strength throughout. CN 2-12 intact.Equal grip strength.   ?Psychiatric:     ?   Behavior: Behavior normal.  ? ? ?ED Results / Procedures / Treatments   ?Labs ?(all labs ordered  are listed, but only abnormal results are displayed) ?Labs Reviewed  ?CBC - Abnormal; Notable for the following components:  ?    Result Value  ? RBC 3.62 (*)   ? HCT 35.6 (*)   ? All other components within normal limits  ?BASIC METABOLIC PANEL - Abnormal; Notable for the following components:  ? Glucose, Bld 123 (*)   ? Creatinine, Ser 1.02 (*)   ? All other components within normal limits  ?WET PREP, GENITAL  ?I-STAT BETA HCG BLOOD, ED ([facility], [facility], [facility] ONLY)  ?GC/CHLAMYDIA PROBE AMP ([place]) NOT AT [facility]  ? ? ?EKG ?None ? ?Radiology ?US PELVIS (TRANSABDOMINAL ONLY) ? ?Result Date: 05/01/2022 ?CLINICAL DATA:  Vaginal bleeding. EXAM: TRANSABDOMINAL ULTRASOUND OF PELVIS TECHNIQUE: Transabdominal ultrasound examination of the pelvis was performed including evaluation of the uterus, ovaries, adnexal regions, and pelvic cul-de-sac. Patient declined transvaginal imaging. COMPARISON:  09/30/2010 FINDINGS: Uterus Measurements: 13.8 x 6.8 x 8.4 cm = volume: 414 ML. Slightly hypoechoic mass over the midline uterine fundus measuring 3.6 x 2.9 x 4.1 cm. Small hypoechoic mass over the posterior body of the uterus abutting the endometrium measuring 1.4 x 1.7 x 2.1 cm. These likely represent intramural leiomyomas, although both abut the endometrium as submucosal component is possible. Endometrium Thickness: 2.6 mm.  No focal abnormality visualized. Right ovary Measurements: 3.1 x 1.5 x 1.8 cm = volume: 4.3 mL. Normal appearance/no adnexal mass. Normal color Doppler. Left ovary Measurements: 2.7 x 1.6 x 1.8 cm = volume: 3.7 mL. Normal appearance/no adnexal mass. Normal color Doppler. Other findings:  No abnormal free fluid. IMPRESSION: 1. Normal size uterus demonstrating to intramural fibroids with the largest measuring 4.1 cm over the midline fundus. Both of these fibroids are likely intramural in nature although both abut the endometrium as submucosal component is possible. 2.  Normal ovaries. Electronically Signed   By: Marin Olp M.D.   On: 05/01/2022 10:50   ? ?Procedures ?Procedures  ? ? ?Medications Ordered in ED ?Medications - No data to display ? ?ED Course/ Medical Decision Making/ A&P ?  ?                        ?Medical Decision Making ?Problems Addressed: ?Abnormal uterine bleeding: acute illness or injury ?Fibroids: acute illness or injury ? ?Amount and/or Complexity of Data Reviewed ?Independent Historian: parent ?External Data Reviewed: labs. ?Labs: ordered. Decision-making details documented in ED Course. ?Radiology: ordered and independent interpretation performed. Decision-making details documented in ED Course. ?ECG/medicine tests: independent interpretation performed. Decision-making details documented in ED Course. ? ?Risk ?Prescription drug management. ? ? ?Irregular vaginal bleeding.  Vital stable, no pain.  Hypertensive on arrival did not take her medications today. ? ?hCG is negative.  Hemoglobin is stable at 12 ? ?Ultrasound today shows normal uterus with multiple fibroids ?Orthostatics are negative.  Patient remains hypertensive.  Did not take her medications today ? ?D/w Dr. Gala Romney who is covering for Dr. Kennon Rounds.  ?She recommends 10 mg Provera twice daily for 1 week on a once daily for 1 week.  Patient with follow-up next week. ? ?Return to the ER with increased bleeding, difficulty breathing, dizziness, lightheadedness, chest pain shortness of breath or other concerns ? ? ? ? ? ? ? ?Final Clinical Impression(s) / ED Diagnoses ?Final diagnoses:  ?Abnormal uterine bleeding  ?Fibroids  ? ? ?Rx / DC Orders ?ED Discharge Orders   ? ? None  ? ?  ? ? ?  ?Ezequiel Essex, MD ?05/01/22 1221 ? ?

## 2022-05-01 NOTE — ED Triage Notes (Signed)
Pt states she missed her menstrual in April. Pt states this month her menstrual is heavier and has clots for this month. Pt denies soaking more than one pad an hour. Pt states started menstrual 5/4.  ?

## 2022-05-01 NOTE — Discharge Instructions (Addendum)
As we discussed your ultrasound shows that you have fibroids.  Take the hormone prescription as prescribed to help with the bleeding.  It is twice a day for 1 week and then once a day for 1 week.  After this you should see your gynecologist for further medications.  Follow-up is scheduled with your gynecologist.  Return to the ED for worsening pain, difficulty breathing, dizziness, bleeding, chest pain, other concerns ?

## 2022-05-04 LAB — GC/CHLAMYDIA PROBE AMP (~~LOC~~) NOT AT ARMC
Chlamydia: NEGATIVE
Comment: NEGATIVE
Comment: NORMAL
Neisseria Gonorrhea: NEGATIVE

## 2022-05-05 ENCOUNTER — Encounter: Payer: Self-pay | Admitting: Physician Assistant

## 2022-05-05 ENCOUNTER — Ambulatory Visit (INDEPENDENT_AMBULATORY_CARE_PROVIDER_SITE_OTHER): Payer: Self-pay | Admitting: Physician Assistant

## 2022-05-05 ENCOUNTER — Telehealth: Payer: Self-pay | Admitting: Family Medicine

## 2022-05-05 VITALS — BP 130/80 | HR 95 | Ht 68.0 in | Wt 220.0 lb

## 2022-05-05 DIAGNOSIS — I10 Essential (primary) hypertension: Secondary | ICD-10-CM

## 2022-05-05 DIAGNOSIS — F419 Anxiety disorder, unspecified: Secondary | ICD-10-CM

## 2022-05-05 DIAGNOSIS — Z6833 Body mass index (BMI) 33.0-33.9, adult: Secondary | ICD-10-CM

## 2022-05-05 DIAGNOSIS — E6609 Other obesity due to excess calories: Secondary | ICD-10-CM

## 2022-05-05 DIAGNOSIS — E782 Mixed hyperlipidemia: Secondary | ICD-10-CM | POA: Insufficient documentation

## 2022-05-05 MED ORDER — BUSPIRONE HCL 5 MG PO TABS: 5.0000 mg | ORAL_TABLET | Freq: Two times a day (BID) | ORAL | 3 refills | Status: DC

## 2022-05-05 MED ORDER — LOSARTAN POTASSIUM-HCTZ 50-12.5 MG PO TABS: 1.0000 | ORAL_TABLET | Freq: Every day | ORAL | 3 refills | Status: DC

## 2022-05-05 NOTE — Progress Notes (Signed)
Established Patient Office Visit  Subjective:  Patient ID: Alicia Fry, female    DOB: [birth year]  Age: 51 y.o. MRN: [id number]  CC:  Chief Complaint  Patient presents with   Medication Refill    Med check- no concerns just needs refills    HPI Alicia Fry presents for follow up of HTN; at home 3 days ago BP: 128/75; is eating a healthy diet, is not eating red meat, plans to stop eating chicken soon; drinks 8 glasses of water most days; has decreased smoking cigarettes; reports that she has a lot of anxiety   Outpatient Medications Prior to Visit  Medication Sig Dispense Refill   Multiple Vitamin (MULTIVITAMIN) tablet Take 1 tablet by mouth daily.     omega-3 acid ethyl esters (LOVAZA) 1 g capsule Take by mouth 2 (two) times daily.     losartan-hydrochlorothiazide (HYZAAR) 50-12.5 MG tablet Take 1 tablet by mouth daily. 90 tablet 1   medroxyPROGESTERone (PROVERA) 10 MG tablet Take 1 tablet (10 mg total) by mouth in the morning and at bedtime. Twice daily for one week and then once daily for one week (Patient not taking: Reported on 05/05/2022) 21 tablet 0   No facility-administered medications prior to visit.    No Known Allergies  Patient Care Team: Marcellina Millin as PCP - General (Physician Assistant)  ROS Review of Systems  Constitutional:  Negative for activity change and chills.  HENT:  Negative for congestion and voice change.   Eyes:  Negative for pain and redness.  Respiratory:  Negative for cough and wheezing.   Cardiovascular:  Negative for chest pain.  Gastrointestinal:  Negative for constipation, diarrhea, nausea and vomiting.  Endocrine: Negative for polyuria.  Genitourinary:  Negative for frequency.  Skin:  Negative for color change and rash.  Allergic/Immunologic: Negative for immunocompromised state.  Neurological:  Negative for dizziness.  Psychiatric/Behavioral:  Negative for agitation. The patient is nervous/anxious.      Objective:     Physical Exam Vitals and nursing note reviewed.  Constitutional:      General: She is not in acute distress.    Appearance: She is normal weight. She is not ill-appearing.  HENT:     Head: Normocephalic and atraumatic.     Right Ear: External ear normal.     Left Ear: External ear normal.  Eyes:     Extraocular Movements: Extraocular movements intact.     Conjunctiva/sclera: Conjunctivae normal.     Pupils: Pupils are equal, round, and reactive to light.  Cardiovascular:     Rate and Rhythm: Normal rate and regular rhythm.  Pulmonary:     Effort: Pulmonary effort is normal.     Breath sounds: Normal breath sounds. No wheezing.  Abdominal:     General: Bowel sounds are normal.     Palpations: Abdomen is soft.  Musculoskeletal:        General: Normal range of motion.     Cervical back: Normal range of motion.  Skin:    General: Skin is warm and dry.  Neurological:     Mental Status: She is alert and oriented to person, place, and time.  Psychiatric:        Mood and Affect: Mood normal.    BP 130/80   Pulse 95   Ht '5\' 8"'$  (1.727 m)   Wt 220 lb (99.8 kg)   SpO2 97%   BMI 33.45 kg/m   Wt Readings from Last 3 Encounters:  05/05/22  220 lb (99.8 kg)  05/01/22 220 lb (99.8 kg)  01/01/22 218 lb 6.4 oz (99.1 kg)    Results for orders placed or performed during the hospital encounter of 05/01/22  Wet prep, genital   Specimen: Cervix  Result Value Ref Range   Yeast Wet Prep HPF POC NONE SEEN NONE SEEN   Trich, Wet Prep NONE SEEN NONE SEEN   Clue Cells Wet Prep HPF POC NONE SEEN NONE SEEN   WBC, Wet Prep HPF POC <10 <10   Sperm NONE SEEN   CBC  Result Value Ref Range   WBC 5.0 4.0 - 10.5 K/uL   RBC 3.62 (L) 3.87 - 5.11 MIL/uL   Hemoglobin 12.0 12.0 - 15.0 g/dL   HCT 35.6 (L) 36.0 - 46.0 %   MCV 98.3 80.0 - 100.0 fL   MCH 33.1 26.0 - 34.0 pg   MCHC 33.7 30.0 - 36.0 g/dL   RDW 12.6 11.5 - 15.5 %   Platelets 218 150 - 400 K/uL   nRBC 0.0 0.0 - 0.2 %  Basic  metabolic panel  Result Value Ref Range   Sodium 137 135 - 145 mmol/L   Potassium 3.6 3.5 - 5.1 mmol/L   Chloride 106 98 - 111 mmol/L   CO2 24 22 - 32 mmol/L   Glucose, Bld 123 (H) 70 - 99 mg/dL   BUN 14 6 - 20 mg/dL   Creatinine, Ser 1.02 (H) 0.44 - 1.00 mg/dL   Calcium 8.9 8.9 - 10.3 mg/dL   GFR, Estimated >60 >60 mL/min   Anion gap 7 5 - 15  I-Stat beta hCG blood, ED  Result Value Ref Range   I-stat hCG, quantitative <5.0 <5 mIU/mL   Comment 3          GC/Chlamydia probe amp ([place]) not at [facility]  Result Value Ref Range   Chlamydia Negative    Neisseria Gonorrhea Negative    Comment Normal Reference [place] Chlamydia - Negative    Comment      Normal Reference Range Neisseria Gonorrhea - Negative     Last CBC Lab Results  Component Value Date   WBC 5.0 05/01/2022   HGB 12.0 05/01/2022   HCT 35.6 (L) 05/01/2022   MCV 98.3 05/01/2022   MCH 33.1 05/01/2022   RDW 12.6 05/01/2022   PLT 218 40/09/2724   Last metabolic panel Lab Results  Component Value Date   GLUCOSE 123 (H) 05/01/2022   NA 137 05/01/2022   K 3.6 05/01/2022   CL 106 05/01/2022   CO2 24 05/01/2022   BUN 14 05/01/2022   CREATININE 1.02 (H) 05/01/2022   GFRNONAA >60 05/01/2022   CALCIUM 8.9 05/01/2022   PROT 7.6 12/25/2020   ALBUMIN 4.3 12/25/2020   LABGLOB 3.3 12/25/2020   AGRATIO 1.3 12/25/2020   BILITOT 0.6 12/25/2020   ALKPHOS 105 12/25/2020   AST 17 12/25/2020   ALT 15 12/25/2020   ANIONGAP 7 05/01/2022   Last lipids Lab Results  Component Value Date   CHOL 201 (H) 12/25/2020   HDL 42 12/25/2020   LDLCALC 146 (H) 12/25/2020   TRIG 73 12/25/2020   CHOLHDL 4.8 (H) 12/25/2020   Last hemoglobin A1c Lab Results  Component Value Date   HGBA1C 5.8 (H) 12/25/2020   Last thyroid functions Lab Results  Component Value Date   TSH 1.540 12/25/2020      The 10-year ASCVD risk score (Arnett DK, et al., 2019) is: 9.9%    Assessment &  Plan:   Problem List Items Addressed This  Visit       Cardiovascular and Mediastinum   Primary hypertension - Primary   Relevant Medications   omega-3 acid ethyl esters (LOVAZA) 1 g capsule   losartan-hydrochlorothiazide (HYZAAR) 50-12.5 MG tablet     Other   Mixed hyperlipidemia   Relevant Medications   omega-3 acid ethyl esters (LOVAZA) 1 g capsule   losartan-hydrochlorothiazide (HYZAAR) 50-12.5 MG tablet   Anxiety   Relevant Medications   busPIRone (BUSPAR) 5 MG tablet    Meds ordered this encounter  Medications   losartan-hydrochlorothiazide (HYZAAR) 50-12.5 MG tablet    Sig: Take 1 tablet by mouth daily.    Dispense:  90 tablet    Refill:  3    Order Specific Question:   Supervising Provider    Answer:   Denita Lung [[id number]]   busPIRone (BUSPAR) 5 MG tablet    Sig: Take 1 tablet (5 mg total) by mouth 2 (two) times daily.    Dispense:  60 tablet    Refill:  3    Order Specific Question:   Supervising Provider    Answer:   Denita Lung [[id number]]    Follow-up: Return in about 6 months (around 11/05/2022) for Return for Annual Exam with PCP Jimmye Norman.    Irene Pap, PA-C

## 2022-05-05 NOTE — Telephone Encounter (Signed)
Called patient to schedule endo biopsy, there was no answer to the phone call so a voicemail was left with the call back number for the office and a letter was mailed.  ?

## 2022-05-06 ENCOUNTER — Telehealth: Payer: Self-pay | Admitting: Physician Assistant

## 2022-05-06 NOTE — Telephone Encounter (Signed)
Pt called and states that at her appt yesterday she failed to have a DOT form completed. She emailed that to me secured. I am sending back in folder to be completed.  ?

## 2022-05-07 ENCOUNTER — Encounter: Payer: Self-pay | Admitting: Physician Assistant

## 2022-05-07 DIAGNOSIS — E6609 Other obesity due to excess calories: Secondary | ICD-10-CM | POA: Insufficient documentation

## 2022-05-07 NOTE — Telephone Encounter (Signed)
I am not able to fill out a DOT form if it is for a physical for [employer] license. She can ask her employer where they recommend that she go or she can find an Urgent Care that does DOTphysicals.

## 2022-05-07 NOTE — Assessment & Plan Note (Signed)
Stable, start buspar 5 mg bid

## 2022-05-07 NOTE — Assessment & Plan Note (Signed)
Stable, drink 8 - 10 glasses of water daily, limit sugar intake and eat a low fat diet, exercise 3 - 5 days a week, example walking 1 - 2 miles  ? ?

## 2022-05-07 NOTE — Telephone Encounter (Signed)
Left a message for pt to call be back concerning DOT form.

## 2022-05-07 NOTE — Telephone Encounter (Signed)
No problem. Thanks 

## 2022-05-07 NOTE — Assessment & Plan Note (Signed)
controlled, continue losartan-HCTZ 50-12.5 mg qd, eat a low salt diet, do not add any salt to food when cooking, avoid processed foods, avoid fried foods

## 2022-05-07 NOTE — Assessment & Plan Note (Signed)
eat a low fat diet, increase fiber intake (Benefiber or Metamucil, Cherrios,  oatmeal, beans, nuts, fruits and vegetables), limit saturated fats (in fried foods, red meat), can add OTC fish oil supplement, eat fish with Omega-3 fatty acids like salmon and tuna, exercise for 30 minutes 3 - 5 times a week, drink 8 - 10 glasses of water a day   

## 2022-05-08 NOTE — Progress Notes (Deleted)
    SUBJECTIVE:   CHIEF COMPLAINT / HPI:  No chief complaint on file.   Last seen by prior PCP on 5/16 for HTN follow-up.   Seen in the ED 5/12 for AUB. Hemoglobin stable. US showed multiple fibroids. OB/GYN recommended discharge with 10 mg Provera BID x 1 week followed by daily x 1 week.  PERTINENT  PMH / PSH:  HTN - on losartan-HCTZ 50-12.5 mg Anxiety - on buspirone 5 mg BID  Patient Care Team: Marcellina Millin as PCP - General (Physician Assistant)   OBJECTIVE:   There were no vitals taken for this visit.  Physical Exam      05/05/2022    2:17 PM  Depression screen PHQ 2/9  Decreased Interest 0  Down, Depressed, Hopeless 0  PHQ - 2 Score 0     {Show previous vital signs (optional):23777}  {Labs  Heme  Chem  Endocrine  Serology  Results Review (optional):23779}  ASSESSMENT/PLAN:   No problem-specific Assessment & Plan notes found for this encounter.    No follow-ups on file.   Zola Button, MD [place]

## 2022-05-08 NOTE — Patient Instructions (Signed)
It was nice seeing you today!  Blood work today.  See me in 3 months or whenever is a good for you.  Stay well, Corynn Solberg, MD  [facility] [phone]  --  Make sure to check out at the front desk before you leave today.  Please arrive at least 15 minutes prior to your scheduled appointments.  If you had blood work today, I will send you a MyChart message or a letter if results are normal. Otherwise, I will give you a call.  If you had a referral placed, they will call you to set up an appointment. Please give us a call if you don't hear back in the next 2 weeks.  If you need additional refills before your next appointment, please call your pharmacy first.  

## 2022-05-11 ENCOUNTER — Ambulatory Visit (INDEPENDENT_AMBULATORY_CARE_PROVIDER_SITE_OTHER): Payer: Self-pay | Admitting: Family Medicine

## 2022-05-11 ENCOUNTER — Encounter: Payer: Self-pay | Admitting: Family Medicine

## 2022-05-11 NOTE — Progress Notes (Signed)
Pt was scheduled today to establish care however it appears that pt already has a primary care doctor.  I clarified with her that we are just like her doctors at [facility].  I informed her that we are not a gyn office, which she thought she was coming to today.  She is happy with keeping her care at [facility] and appreciated me clarifying how our offices functioned. Alicia Fry, CMA

## 2022-05-26 ENCOUNTER — Encounter: Payer: Self-pay | Admitting: Medical

## 2022-05-26 ENCOUNTER — Encounter: Payer: Self-pay | Admitting: *Deleted

## 2022-06-15 ENCOUNTER — Encounter: Payer: Self-pay | Admitting: Obstetrics and Gynecology

## 2022-06-15 ENCOUNTER — Other Ambulatory Visit (HOSPITAL_COMMUNITY)
Admission: RE | Admit: 2022-06-15 | Discharge: 2022-06-15 | Disposition: A | Discharge status: Home / Self Care | Payer: Commercial Managed Care - HMO | Source: Ambulatory Visit | Attending: Obstetrics and Gynecology | Admitting: Obstetrics and Gynecology

## 2022-06-15 ENCOUNTER — Ambulatory Visit (INDEPENDENT_AMBULATORY_CARE_PROVIDER_SITE_OTHER): Payer: Commercial Managed Care - HMO | Admitting: Obstetrics and Gynecology

## 2022-06-15 VITALS — BP 170/104 | HR 98 | Ht 68.5 in | Wt 213.2 lb

## 2022-06-15 DIAGNOSIS — N882 Stricture and stenosis of cervix uteri: Secondary | ICD-10-CM | POA: Diagnosis not present

## 2022-06-15 DIAGNOSIS — N939 Abnormal uterine and vaginal bleeding, unspecified: Secondary | ICD-10-CM | POA: Diagnosis not present

## 2022-06-15 DIAGNOSIS — I10 Essential (primary) hypertension: Secondary | ICD-10-CM

## 2022-06-15 DIAGNOSIS — Z124 Encounter for screening for malignant neoplasm of cervix: Secondary | ICD-10-CM

## 2022-06-15 DIAGNOSIS — D219 Benign neoplasm of connective and other soft tissue, unspecified: Secondary | ICD-10-CM | POA: Insufficient documentation

## 2022-06-15 HISTORY — PX: ENDOMETRIAL BIOPSY: PRO73

## 2022-06-15 LAB — POCT PREGNANCY, URINE: Preg Test, Ur: NEGATIVE

## 2022-06-15 MED ORDER — MEDROXYPROGESTERONE ACETATE 10 MG PO TABS: 10.0000 mg | ORAL_TABLET | Freq: Two times a day (BID) | ORAL | 1 refills | Status: AC | PRN

## 2022-06-15 NOTE — Progress Notes (Signed)
Obstetrics and Gynecology New Patient Evaluation  Appointment Date: 06/15/2022  OBGYN Clinic: [facility]  Primary Care Provider: Littie Deeds  Referring Provider: [facility]  Chief Complaint: AUB   History of Present Illness: Alicia Fry is a 51 y.o. African-American [id number] (LMP: 6/3), seen for the above chief complaint. Her past medical history is significant for HTN, fibroids, BMI 32  Patient with AUB for the month of may. She skipped her period in April but always had qmonth, regular, 5-7d periods (someway heavy in first few days) prior to that. In the ED, she had neg gc/ct/wet prep and an u/s that showed fibroids with a thin uterine lining (see below), with hgb 12.0  She was put on provera and referred to this clinic.  She states the provera stopped the bleeding and she took it for the bid x 7d it was written for. She had a period for about a week earlier this month but no bleeding since.   +hot flashes, night sweats; no vaginal dryness  Review of Systems: Pertinent items noted in HPI and remainder of comprehensive ROS otherwise negative.    Patient Active Problem List   Diagnosis Date Noted   Fibroids 06/15/2022   Class 1 obesity due to excess calories with serious comorbidity and body mass index (BMI) of 33.0 to 33.9 in adult 05/07/2022   Mixed hyperlipidemia 05/05/2022   Primary hypertension 05/05/2022   Anxiety 05/05/2022    Past Medical History:  Past Medical History:  Diagnosis Date   Hypertension    Obesity (BMI 30-39.9) 12/25/2020    Past Surgical History:  Past Surgical History:  Procedure Laterality Date   ENDOMETRIAL BIOPSY  06/15/2022   LASIK      Past Obstetrical History:  OB History  Gravida Para Term Preterm AB Living  5 5 5     5   SAB IAB Ectopic Multiple Live Births          5    # Outcome Date GA Lbr Len/2nd Weight Sex Delivery Anes PTL Lv  5 Term           4 Term           3 Term           2  Term           1 Term             Obstetric Comments  SVD x 5    Past Gynecological History: As per HPI. History of Pap Smear(s): unknown She is currently using no method for contraception.   Social History:  Social History   Socioeconomic History   Marital status: Single    Spouse name: Not on file   Number of children: Not on file   Years of education: Not on file   Highest education level: Not on file  Occupational History   Not on file  Tobacco Use   Smoking status: Some Days    Packs/day: 0.25    Years: 17.00    Total pack years: 4.25    Types: Cigarettes   Smokeless tobacco: Never  Substance and Sexual Activity   Alcohol use: Not Currently    Comment: rare    Drug use: No   Sexual activity: Yes    Birth control/protection: Condom  Other Topics Concern   Not on file  Social History Narrative   Not on file   Social Determinants of Corporate investment banker  Strain: Not on file  Food Insecurity: Not on file  Transportation Needs: Not on file  Physical Activity: Not on file  Stress: Not on file  Social Connections: Not on file  Intimate Partner Violence: Not on file    Family History:  Family History  Problem Relation Age of Onset   Diabetes Mother    Hypertension Father     Medications Haywood Filler had no medications administered during this visit. Current Outpatient Medications  Medication Sig Dispense Refill   losartan-hydrochlorothiazide (HYZAAR) 50-12.5 MG tablet Take 1 tablet by mouth daily. 90 tablet 3   medroxyPROGESTERone (PROVERA) 10 MG tablet Take 1 tablet (10 mg total) by mouth 2 (two) times daily as needed (prolonged vaginal bleeding). 30 tablet 1   Multiple Vitamin (MULTIVITAMIN) tablet Take 1 tablet by mouth daily.     omega-3 acid ethyl esters (LOVAZA) 1 g capsule Take by mouth 2 (two) times daily.     busPIRone (BUSPAR) 5 MG tablet Take 1 tablet (5 mg total) by mouth 2 (two) times daily. (Patient not taking: Reported on  06/15/2022) 60 tablet 3   No current facility-administered medications for this visit.    Allergies Patient has no known allergies.   Physical Exam:  BP (!) 170/104   Pulse 98   Ht 5' 8.5" (1.74 m)   Wt 213 lb 3.2 oz (96.7 kg)   BMI 31.95 kg/m  Body mass index is 31.95 kg/m. General appearance: Well nourished, well developed female in no acute distress.  Cardiovascular: normal s1 and s2.  No murmurs, rubs or gallops. Respiratory:  Clear to auscultation bilateral. Normal respiratory effort Abdomen: positive bowel sounds and no masses, hernias; diffusely non tender to palpation, non distended Neuro/Psych:  Normal mood and affect.  Skin:  Warm and dry.  Lymphatic:  No inguinal lymphadenopathy.   Pelvic exam: is not limited by body habitus EGBUS: within normal limits Vagina: within normal limits and with no blood or discharge in the vault Cervix: normal appearing cervix without tenderness, discharge or lesions. Uterus:  enlarged, c/w 10 week size and non tender Adnexa:  normal adnexa and no mass, fullness, tenderness Rectovaginal: deferred  See procedure note for embx; cervical stenosis noted on procedure  Laboratory: UPT neg. As per HPI  Radiology: images reviewed Narrative & Impression  CLINICAL DATA:  Vaginal bleeding.   EXAM: TRANSABDOMINAL ULTRASOUND OF PELVIS   TECHNIQUE: Transabdominal ultrasound examination of the pelvis was performed including evaluation of the uterus, ovaries, adnexal regions, and pelvic cul-de-sac. Patient declined transvaginal imaging.   COMPARISON:  09/30/2010   FINDINGS: Uterus   Measurements: 13.8 x 6.8 x 8.4 cm = volume: 414 ML. Slightly hypoechoic mass over the midline uterine fundus measuring 3.6 x 2.9 x 4.1 cm. Small hypoechoic mass over the posterior body of the uterus abutting the endometrium measuring 1.4 x 1.7 x 2.1 cm. These likely represent intramural leiomyomas, although both abut the endometrium as submucosal component  is possible.   Endometrium   Thickness: 2.6 mm.  No focal abnormality visualized.   Right ovary   Measurements: 3.1 x 1.5 x 1.8 cm = volume: 4.3 mL. Normal appearance/no adnexal mass. Normal color Doppler.   Left ovary   Measurements: 2.7 x 1.6 x 1.8 cm = volume: 3.7 mL. Normal appearance/no adnexal mass. Normal color Doppler.   Other findings:  No abnormal free fluid.   IMPRESSION: 1. Normal size uterus demonstrating to intramural fibroids with the largest measuring 4.1 cm over the midline fundus. Both  of these fibroids are likely intramural in nature although both abut the endometrium as submucosal component is possible.   2.  Normal ovaries.     Electronically Signed   By: Elberta Fortis M.D.   On: 05/01/2022 10:50    Assessment: pt doing well  Plan: 1. Cervical cancer screening - Cytology - PAP( [facility]) - Surgical pathology( [facility]/ [facility])  2. Primary hypertension Followed by a PCP  3. Abnormal uterine bleeding (AUB) D/w her that s/s are likely due to perimenopause but recommended biopsy and pap which she was amenable to. I gave her precautions re: perimenopausal bleeding and to call us with any future issues but likely not due to her fibroids. PRN provera sent in and pt told to use as needed and let us know if this happens again in the future. I told her that she may go months w/o a period which is normal and a whole year w/o a period means she's in menopause - Surgical pathology( [facility]/ [facility])  4. Cervical stenosis (uterine cervix)  Orders Placed This Encounter  Procedures   Pregnancy, urine POC    RTC PRN  Cornelia Copa MD Attending [facility])

## 2022-06-17 LAB — SURGICAL PATHOLOGY

## 2022-06-19 LAB — CYTOLOGY - PAP
Comment: NEGATIVE
Diagnosis: UNDETERMINED — AB
High risk HPV: NEGATIVE

## 2022-08-18 ENCOUNTER — Encounter: Payer: Self-pay | Admitting: Medical

## 2022-10-20 ENCOUNTER — Encounter: Payer: Self-pay | Admitting: Medical

## 2022-10-20 ENCOUNTER — Telehealth: Payer: Self-pay | Admitting: Medical

## 2022-10-20 DIAGNOSIS — Z0000 Encounter for general adult medical examination without abnormal findings: Secondary | ICD-10-CM

## 2022-10-20 NOTE — Progress Notes (Deleted)
Colon Mammo  Consider EKG  Shingrix Flu Covid

## 2022-10-20 NOTE — Telephone Encounter (Signed)

## 2022-10-21 ENCOUNTER — Encounter: Payer: Self-pay | Admitting: Medical

## 2022-10-27 ENCOUNTER — Other Ambulatory Visit: Payer: Self-pay | Admitting: Physician Assistant

## 2022-11-05 ENCOUNTER — Encounter: Payer: Self-pay | Admitting: Physician Assistant

## 2022-11-17 ENCOUNTER — Encounter: Payer: Self-pay | Admitting: Nurse Practitioner

## 2022-11-24 ENCOUNTER — Ambulatory Visit (INDEPENDENT_AMBULATORY_CARE_PROVIDER_SITE_OTHER): Payer: Commercial Managed Care - HMO | Admitting: Nurse Practitioner

## 2022-11-24 ENCOUNTER — Encounter: Payer: Self-pay | Admitting: Nurse Practitioner

## 2022-11-24 VITALS — BP 130/84 | HR 85 | Temp 98.4°F | Ht 68.75 in | Wt 222.0 lb

## 2022-11-24 DIAGNOSIS — E6609 Other obesity due to excess calories: Secondary | ICD-10-CM

## 2022-11-24 DIAGNOSIS — F419 Anxiety disorder, unspecified: Secondary | ICD-10-CM

## 2022-11-24 DIAGNOSIS — Z0000 Encounter for general adult medical examination without abnormal findings: Secondary | ICD-10-CM | POA: Diagnosis not present

## 2022-11-24 DIAGNOSIS — I10 Essential (primary) hypertension: Secondary | ICD-10-CM

## 2022-11-24 DIAGNOSIS — Z6833 Body mass index (BMI) 33.0-33.9, adult: Secondary | ICD-10-CM

## 2022-11-24 DIAGNOSIS — E782 Mixed hyperlipidemia: Secondary | ICD-10-CM

## 2022-11-24 NOTE — Patient Instructions (Addendum)
It was a pleasure to meet you today!!  We will see what your labs show and if we have any concerns I will let you know.    Here are some tips for your weight: WEIGHT LOSS PLANNING Your progress today shows:     11/24/2022   10:23 AM 06/15/2022   10:54 AM 05/11/2022    2:25 PM  Vitals with BMI  Height 5' 8.75" 5' 8.5" '5\' 8"'$   Weight 222 lbs 213 lbs 3 oz 214 lbs 10 oz  BMI 33.03 93.71 69.67  Systolic 893 810 175  Diastolic 84 102 90  Pulse 85 98 103    For best management of weight, it is vital to balance intake versus output. This means the number of calories burned per day must be less than the calories you take in with food and drink.   I recommend trying to follow a diet with the following: Calories: 1200-1500 calories per day Carbohydrates: 150-180 grams of carbohydrates per day  Why: Gives your body enough "quick fuel" for cells to maintain normal function without sending them into starvation mode.  Protein: At least 45-55 grams of protein per day Why: Protein takes longer and uses more energy than carbohydrates to break down for fuel. The carbohydrates in your meals serves as quick energy sources and proteins help use some of that extra quick energy to break down to produce long term energy. This helps you not feel hungry as quickly and protein breakdown burns calories.  Water: Drink AT LEAST 64 ounces of water per day  Why: Water is essential to healthy metabolism. Water helps to fill the stomach and keep you fuller longer. Water is required for healthy digestion and filtering of waste in the body.  Fat: Limit fats in your diet- when choosing fats, choose foods with lower fats content such as lean meats (chicken, fish, [place]).  Why: Increased fat intake leads to storage "for later". Once you burn your carbohydrate energy, your body goes into fat and protein breakdown mode to help you loose weight.  Cholesterol: Fats and oils that are LIQUID at room temperature are best. Choose  vegetable oils (olive oil, avocado oil, nuts). Avoid fats that are SOLID at room temperature (animal fats, processed meats). Healthy fats are often found in whole grains, beans, nuts, seeds, and berries.  Why: Elevated cholesterol levels lead to build up of cholesterol on the inside of your blood vessels. This will eventually cause the blood vessels to become hard and can lead to high blood pressure and damage to your organs. When the blood flow is reduced, but the pressure is high from cholesterol buildup, parts of the cholesterol can break off and form clots that can go to the brain or heart leading to a stroke or heart attack.  Fiber: Increase amount of SOLUBLE the fiber in your diet. This helps to fill you up, lowers cholesterol, and helps with digestion. Some foods high in soluble fiber are oats, peas, beans, apples, carrots, barley, and citrus fruits.   Why: Fiber fills you up, helps remove excess cholesterol, and aids in healthy digestion which are all very important in weight management.   I recommend the following as a minimum activity routine: Purposeful walk or other physical activity at least 20 minutes every single day. This means purposefully taking a walk, jog, bike, swim, treadmill, elliptical, dance, etc.  This activity should be ABOVE your normal daily activities, such as walking at work. Goal exercise should be at least 150  minutes a week- work your way up to this.   Heart Rate: Your maximum exercise heart rate should be 220 - Your Age in Years. When exercising, get your heart rate up, but avoid going over the maximum targeted heart rate.  60-70% of your maximum heart rate is where you tend to burn the most fat. To find this number:  220 - Age In Years= Max HR  Max HR x 0.6 (or 0.7) = Fat Burning HR The Fat Burning HR is your goal heart rate while working out to burn the most fat.  NEVER exercise to the point your feel lightheaded, weak, nauseated, dizzy. If you experience ANY of  these symptoms- STOP exercise! Allow yourself to cool down and your heart rate to come down. Then restart slower next time.  If at [place] you feel chest pain or chest pressure during exercise, STOP IMMEDIATELY and seek medical attention.

## 2022-11-24 NOTE — Progress Notes (Signed)
[name], DNP, AGNP-c [name], [place] [address] Main Office [phone]  BP 130/84   Pulse 85   Temp 98.4 F (36.9 C)   Ht 5' 8.75" (1.746 m)   Wt 222 lb (100.7 kg)   LMP 11/08/2022   BMI 33.02 kg/m    Subjective:    Patient ID: Alicia Fry, female    DOB: 12-May-1971, 51 y.o.   MRN: [id number]  HPI: Alicia Fry is a 51 y.o. female presenting on 11/24/2022 for comprehensive medical examination.   Current medical concerns include: weight management  She reports regular vision exams q1-5y: Yes  She reports regular dental exams q 69m  Yes  The patient eats a regular, healthy diet. She endorses exercise and/or activity of:  routine  She denies concerns with STI today, testing was not ordered  A comprehensive review of systems was negative.  Most Recent Depression Screen:     11/24/2022   10:26 AM 05/11/2022    2:23 PM 05/05/2022    2:17 PM 12/25/2020    9:52 AM 10/09/2019    2:34 PM  Depression screen PHQ 2/9  Decreased Interest 0 0 0 0 0  Down, Depressed, Hopeless 0 0 0 0 0  PHQ - 2 Score 0 0 0 0 0  Altered sleeping  0     Tired, decreased energy  0     Change in appetite  0     Feeling bad or failure about yourself   0     Trouble concentrating  0     Moving slowly or fidgety/restless  0     Suicidal thoughts  0     PHQ-9 Score  0     Difficult doing work/chores  Not difficult at all      Most Recent Anxiety Screen:      No data to display         Most Recent Fall Screen:    11/24/2022   10:26 AM 05/05/2022    2:17 PM 10/09/2019    2:34 PM 08/08/2019    2:43 PM  Fall Risk   Falls in the past year? 0 0 0 0  Number falls in past yr: 0 0 0 0  Injury with Fall? 0 0 0 0  Risk for fall due to : History of fall(s) No Fall Risks    Follow up Falls evaluation completed Falls evaluation completed      Past medical history, surgical history, medications, allergies, family history and social history  reviewed with patient today and changes made to appropriate areas of the chart.  Past Medical History:  Past Medical History:  Diagnosis Date   Hypertension    Obesity (BMI 30-39.9) 12/25/2020   Medications:  Current Outpatient Medications on File Prior to Visit  Medication Sig   losartan-hydrochlorothiazide (HYZAAR) 50-12.5 MG tablet TAKE 1 TABLET BY MOUTH EVERY DAY   Multiple Vitamin (MULTIVITAMIN) tablet Take 1 tablet by mouth daily.   omega-3 acid ethyl esters (LOVAZA) 1 g capsule Take by mouth 2 (two) times daily.   busPIRone (BUSPAR) 5 MG tablet Take 1 tablet (5 mg total) by mouth 2 (two) times daily. (Patient not taking: Reported on 06/15/2022)   medroxyPROGESTERone (PROVERA) 10 MG tablet Take 1 tablet (10 mg total) by mouth 2 (two) times daily as needed (prolonged vaginal bleeding). (Patient not taking: Reported on 11/24/2022)   No current facility-administered medications on file prior to visit.   Surgical History:  Past Surgical History:  Procedure Laterality Date   ENDOMETRIAL BIOPSY  06/15/2022   LASIK     Allergies:  No Known Allergies Family History:  Family History  Problem Relation Age of Onset   Diabetes Mother    Hypertension Father        Objective:    BP 130/84   Pulse 85   Temp 98.4 F (36.9 C)   Ht 5' 8.75" (1.746 m)   Wt 222 lb (100.7 kg)   LMP 11/08/2022   BMI 33.02 kg/m   Wt Readings from Last 3 Encounters:  11/24/22 222 lb (100.7 kg)  06/15/22 213 lb 3.2 oz (96.7 kg)  05/11/22 214 lb 9.6 oz (97.3 kg)    Physical Exam  Results for orders placed or performed in visit on 11/24/22  CBC with Differential/Platelet  Result Value Ref Range   WBC 4.7 3.4 - 10.8 x10E3/uL   RBC 3.95 3.77 - 5.28 x10E6/uL   Hemoglobin 12.7 11.1 - 15.9 g/dL   Hematocrit 36.9 34.0 - 46.6 %   MCV 93 79 - 97 fL   MCH 32.2 26.6 - 33.0 pg   MCHC 34.4 31.5 - 35.7 g/dL   RDW 12.9 11.7 - 15.4 %   Platelets 234 150 - 450 x10E3/uL   Neutrophils 42 Not Estab. %   Lymphs 47  Not Estab. %   Monocytes 8 Not Estab. %   Eos 3 Not Estab. %   Basos 0 Not Estab. %   Neutrophils Absolute 2.0 1.4 - 7.0 x10E3/uL   Lymphocytes Absolute 2.2 0.7 - 3.1 x10E3/uL   Monocytes Absolute 0.4 0.1 - 0.9 x10E3/uL   EOS (ABSOLUTE) 0.1 0.0 - 0.4 x10E3/uL   Basophils Absolute 0.0 0.0 - 0.2 x10E3/uL   Immature Granulocytes 0 Not Estab. %   Immature Grans (Abs) 0.0 0.0 - 0.1 x10E3/uL  Comprehensive metabolic panel  Result Value Ref Range   Glucose 104 (H) 70 - 99 mg/dL   BUN 9 6 - 24 mg/dL   Creatinine, Ser 0.88 0.57 - 1.00 mg/dL   eGFR 80 >59 mL/min/1.73   BUN/Creatinine Ratio 10 9 - 23   Sodium 136 134 - 144 mmol/L   Potassium 4.3 3.5 - 5.2 mmol/L   Chloride 101 96 - 106 mmol/L   CO2 22 20 - 29 mmol/L   Calcium 8.8 8.7 - 10.2 mg/dL   Total Protein 6.8 6.0 - 8.5 g/dL   Albumin 4.1 3.9 - 4.9 g/dL   Globulin, Total 2.7 1.5 - 4.5 g/dL   Albumin/Globulin Ratio 1.5 1.2 - 2.2   Bilirubin Total 0.4 0.0 - 1.2 mg/dL   Alkaline Phosphatase 92 44 - 121 IU/L   AST 18 0 - 40 IU/L   ALT 17 0 - 32 IU/L  Hemoglobin A1c  Result Value Ref Range   Hgb A1c MFr Bld 5.8 (H) 4.8 - 5.6 %   Est. average glucose Bld gHb Est-mCnc 120 mg/dL  Lipid panel  Result Value Ref Range   Cholesterol, Total 190 100 - 199 mg/dL   Triglycerides 46 0 - 149 mg/dL   HDL 43 >39 mg/dL   VLDL Cholesterol Cal 9 5 - 40 mg/dL   LDL Chol Calc (NIH) 138 (H) 0 - 99 mg/dL   Chol/HDL Ratio 4.4 0.0 - 4.4 ratio  TSH  Result Value Ref Range   TSH 1.330 0.450 - 4.500 uIU/mL  T4, free  Result Value Ref Range   Free T4 1.03 0.82 - 1.77 ng/dL  IMMUNIZATIONS:   Flu: Flu vaccine completed elsewhere this season Prevnar 13: Prevnar 13 N/A for this patient Pneumovax 23: Pneumovax 23 N/A for this patient Vac Shingrix: Shingrix N/A for this patient HPV: HPV N/A for this patient Tetanus: Tetanus completed in the last 10 years  HEALTH MAINTENANCE: Pap Smear HM Status: is up to date Mammogram HM Status: is reported as  completed, but records are needed Colon Cancer Screening HM Status: is reported as completed, but records are needed Bone Density HM Status: is not applicable for this patient STI Testing HM Status: was declined   Eye Exam HM Status: is up to date Urine Micro HM Status: is not applicable for this patient  Spirometry HM Status: is not applicable for this patient      Assessment & Plan:   Problem List Items Addressed This Visit     Mixed hyperlipidemia    Chronic.  No alarm symptoms present at this time.  Labs pending.      Relevant Orders   CBC with Differential/Platelet (Completed)   Comprehensive metabolic panel (Completed)   Hemoglobin A1c (Completed)   Lipid panel (Completed)   TSH (Completed)   T4, free (Completed)   Primary hypertension    Chronic. Slight elevation in BP today- likely related to white coat HTN. No alarm sx present at this time. Recommend at home monitoring. If BP remains greater than 140/85 please contact the office to let us know and we can make changes to the plan of care.  Labs pending.      Relevant Orders   CBC with Differential/Platelet (Completed)   Comprehensive metabolic panel (Completed)   Hemoglobin A1c (Completed)   Lipid panel (Completed)   TSH (Completed)   T4, free (Completed)   Anxiety    Chronic.  No alarm symptoms.  Monitor closely.      Relevant Orders   CBC with Differential/Platelet (Completed)   Comprehensive metabolic panel (Completed)   Hemoglobin A1c (Completed)   Lipid panel (Completed)   TSH (Completed)   T4, free (Completed)   Class 1 obesity due to excess calories with serious comorbidity and body mass index (BMI) of 33.0 to 33.9 in adult    Discussion today with diet and weight management options.  We will obtain labs for evaluation and monitoring.  Patient provided with handout on recommendations.      Encounter for annual physical exam - Primary    CPE today with no abnormalities noted on exam.  Labs pending.  Will make changes as necessary based on results.  Review of HM activities and recommendations discussed and provided on AVS Anticipatory guidance, diet, and exercise recommendations provided.  Medications, allergies, and hx reviewed and updated as necessary.  Plan to f/u with CPE in 1 year or sooner for acute/chronic health needs as directed.        Other Visit Diagnoses     BMI 33.0-33.9,adult       Relevant Orders   CBC with Differential/Platelet (Completed)   Comprehensive metabolic panel (Completed)   Hemoglobin A1c (Completed)   Lipid panel (Completed)   TSH (Completed)   T4, free (Completed)   Health care maintenance       Relevant Orders   CBC with Differential/Platelet (Completed)   Comprehensive metabolic panel (Completed)   Hemoglobin A1c (Completed)   Lipid panel (Completed)   TSH (Completed)   T4, free (Completed)          Follow up plan: Return in about 6 months (around 05/26/2023)  for HTN, Med Mngment.  NEXT PREVENTATIVE PHYSICAL DUE IN 1 YEAR.  PATIENT COUNSELING PROVIDED FOR ALL ADULT PATIENTS:  Consume a well balanced diet low in saturated fats, cholesterol, and moderation in carbohydrates.   This can be as simple as monitoring portion sizes and cutting back on sugary beverages such as soda and juice to start with.    Daily water consumption of at least 64 ounces.  Physical activity at least 180 minutes per week, if just starting out.   This can be as simple as taking the stairs instead of the elevator and walking 2-3 laps around the office  purposefully every day.   STD protection, partner selection, and regular testing if high risk.  Limited consumption of alcoholic beverages if alcohol is consumed.  For women, I recommend no more than 7 alcoholic beverages per week, spread out throughout the week.  Avoid "binge" drinking or consuming large quantities of alcohol in one setting.   Please let me know if you feel you may need help with reduction or  quitting alcohol consumption.   Avoidance of nicotine, if used.  Please let me know if you feel you may need help with reduction or quitting nicotine use.   Daily mental health attention.  This can be in the form of 5 minute daily meditation, prayer, journaling, yoga, reflection, etc.   Purposeful attention to your emotions and mental state can significantly improve your overall wellbeing and Health.  Please know that I am here to help you with all of your health care goals and am happy to work with you to find a solution that works best for you.  The greatest advice I have received with any changes in life are to take it one step at a time, that even means if all you can focus on is the next 60 seconds, then do that and celebrate your victories.  With any changes in life, you will have set backs, and that is OK. The important thing to remember is, if you have a set back, it is not a failure, it is an opportunity to try again!  Health Maintenance Recommendations Screening Testing Mammogram Every 1 -2 years based on history and risk factors Starting at age 39 Pap Smear Ages 21-39 every 3 years Ages 42-65 every 5 years with HPV testing More frequent testing may be required based on results and history Colon Cancer Screening Every 1-10 years based on test performed, risk factors, and history Starting at age 3 Bone Density Screening Every 2-10 years based on history Starting at age 89 for women Recommendations for men differ based on medication usage, history, and risk factors AAA Screening One time ultrasound Men 23-100 years old who have every smoked Lung Cancer Screening Low Dose Lung CT every 12 months Age 28-80 years with a 30 pack-year smoking history who still smoke or who have quit within the last 15 years  Screening Labs Routine  Labs: Complete Blood Count (CBC), Complete Metabolic Panel (CMP), Cholesterol (Lipid Panel) Every 6-12 months based on history and  medications May be recommended more frequently based on current conditions or previous results Hemoglobin A1c Lab Every 3-12 months based on history and previous results Starting at age 61 or earlier with diagnosis of diabetes, high cholesterol, BMI >26, and/or risk factors Frequent monitoring for patients with diabetes to ensure blood sugar control Thyroid Panel (TSH w/ T3 & T4) Every 6 months based on history, symptoms, and risk factors May be repeated more often if  on medication HIV One time testing for all patients 13 and older May be repeated more frequently for patients with increased risk factors or exposure Hepatitis C One time testing for all patients 47 and older May be repeated more frequently for patients with increased risk factors or exposure Gonorrhea, Chlamydia Every 12 months for all sexually active persons 13-24 years Additional monitoring may be recommended for those who are considered high risk or who have symptoms PSA Men 71-77 years old with risk factors Additional screening may be recommended from age 61-69 based on risk factors, symptoms, and history  Vaccine Recommendations Tetanus Booster All adults every 10 years Flu Vaccine All patients 6 months and older every year COVID Vaccine All patients 12 years and older Initial dosing with booster May recommend additional booster based on age and health history HPV Vaccine 2 doses all patients age 50-26 Dosing may be considered for patients over 26 Shingles Vaccine (Shingrix) 2 doses all adults 67 years and older Pneumonia (Pneumovax 23) All adults 95 years and older May recommend earlier dosing based on health history Pneumonia (Prevnar 57) All adults 66 years and older Dosed 1 year after Pneumovax 23  Additional Screening, Testing, and Vaccinations may be recommended on an individualized basis based on family history, health history, risk factors, and/or exposure.

## 2022-11-25 LAB — TSH: TSH: 1.33 u[IU]/mL (ref 0.450–4.500)

## 2022-11-25 LAB — LIPID PANEL
Chol/HDL Ratio: 4.4 ratio (ref 0.0–4.4)
Cholesterol, Total: 190 mg/dL (ref 100–199)
HDL: 43 mg/dL (ref >39)
LDL Chol Calc (NIH): 138 mg/dL — ABNORMAL HIGH (ref 0–99)
Triglycerides: 46 mg/dL (ref 0–149)
VLDL Cholesterol Cal: 9 mg/dL (ref 5–40)

## 2022-11-25 LAB — CBC WITH DIFFERENTIAL/PLATELET
Basophils Absolute: 0 10*3/uL (ref 0.0–0.2)
Basos: 0 %
EOS (ABSOLUTE): 0.1 10*3/uL (ref 0.0–0.4)
Eos: 3 %
Hematocrit: 36.9 % (ref 34.0–46.6)
Hemoglobin: 12.7 g/dL (ref 11.1–15.9)
Immature Grans (Abs): 0 10*3/uL (ref 0.0–0.1)
Immature Granulocytes: 0 %
Lymphocytes Absolute: 2.2 10*3/uL (ref 0.7–3.1)
Lymphs: 47 %
MCH: 32.2 pg (ref 26.6–33.0)
MCHC: 34.4 g/dL (ref 31.5–35.7)
MCV: 93 fL (ref 79–97)
Monocytes Absolute: 0.4 10*3/uL (ref 0.1–0.9)
Monocytes: 8 %
Neutrophils Absolute: 2 10*3/uL (ref 1.4–7.0)
Neutrophils: 42 %
Platelets: 234 10*3/uL (ref 150–450)
RBC: 3.95 x10E6/uL (ref 3.77–5.28)
RDW: 12.9 % (ref 11.7–15.4)
WBC: 4.7 10*3/uL (ref 3.4–10.8)

## 2022-11-25 LAB — COMPREHENSIVE METABOLIC PANEL
ALT: 17 IU/L (ref 0–32)
AST: 18 IU/L (ref 0–40)
Albumin/Globulin Ratio: 1.5 (ref 1.2–2.2)
Albumin: 4.1 g/dL (ref 3.9–4.9)
Alkaline Phosphatase: 92 IU/L (ref 44–121)
BUN/Creatinine Ratio: 10 (ref 9–23)
BUN: 9 mg/dL (ref 6–24)
Bilirubin Total: 0.4 mg/dL (ref 0.0–1.2)
CO2: 22 mmol/L (ref 20–29)
Calcium: 8.8 mg/dL (ref 8.7–10.2)
Chloride: 101 mmol/L (ref 96–106)
Creatinine, Ser: 0.88 mg/dL (ref 0.57–1.00)
Globulin, Total: 2.7 g/dL (ref 1.5–4.5)
Glucose: 104 mg/dL — ABNORMAL HIGH (ref 70–99)
Potassium: 4.3 mmol/L (ref 3.5–5.2)
Sodium: 136 mmol/L (ref 134–144)
Total Protein: 6.8 g/dL (ref 6.0–8.5)
eGFR: 80 mL/min/{1.73_m2} (ref >59)

## 2022-11-25 LAB — HEMOGLOBIN A1C
Est. average glucose Bld gHb Est-mCnc: 120 mg/dL
Hgb A1c MFr Bld: 5.8 % — ABNORMAL HIGH (ref 4.8–5.6)

## 2022-11-25 LAB — T4, FREE: Free T4: 1.03 ng/dL (ref 0.82–1.77)

## 2022-12-09 DIAGNOSIS — Z0000 Encounter for general adult medical examination without abnormal findings: Secondary | ICD-10-CM | POA: Insufficient documentation

## 2022-12-09 NOTE — Assessment & Plan Note (Signed)
Chronic.  No alarm symptoms.  Monitor closely.

## 2022-12-09 NOTE — Assessment & Plan Note (Signed)
Chronic. Slight elevation in BP today- likely related to white coat HTN. No alarm sx present at this time. Recommend at home monitoring. If BP remains greater than 140/85 please contact the office to let us know and we can make changes to the plan of care.  Labs pending.

## 2022-12-09 NOTE — Assessment & Plan Note (Signed)

## 2022-12-09 NOTE — Assessment & Plan Note (Signed)
Discussion today with diet and weight management options.  We will obtain labs for evaluation and monitoring.  Patient provided with handout on recommendations.

## 2022-12-09 NOTE — Assessment & Plan Note (Signed)
Chronic.  No alarm symptoms present at this time.  Labs pending.

## 2022-12-31 ENCOUNTER — Encounter: Payer: Self-pay | Admitting: Nurse Practitioner

## 2023-01-11 NOTE — Telephone Encounter (Signed)
Pt called again about a form that was supposed to be filled out at her visit on 11/24/22 for her insurance and I couldn't find it up front. She is going to email me another copy to be filled out. She says she has to have it completed by February to get credit.

## 2023-01-28 ENCOUNTER — Other Ambulatory Visit: Payer: Self-pay | Admitting: Medical

## 2023-03-09 IMAGING — US US PELVIS COMPLETE
1 series · 13 of 25 positions shown · non-contrast
Comparison: 09/30/2010

CLINICAL DATA: Vaginal bleeding.

EXAM:
TRANSABDOMINAL ULTRASOUND OF PELVIS
TECHNIQUE: Transabdominal ultrasound examination of the pelvis was performed
including evaluation of the uterus, ovaries, adnexal regions, and
pelvic cul-de-sac. Patient declined transvaginal imaging.

[Series 1: us pelvic complete with transvaginal · 81 acquisitions, 13 frames shown]
[im 1/81]
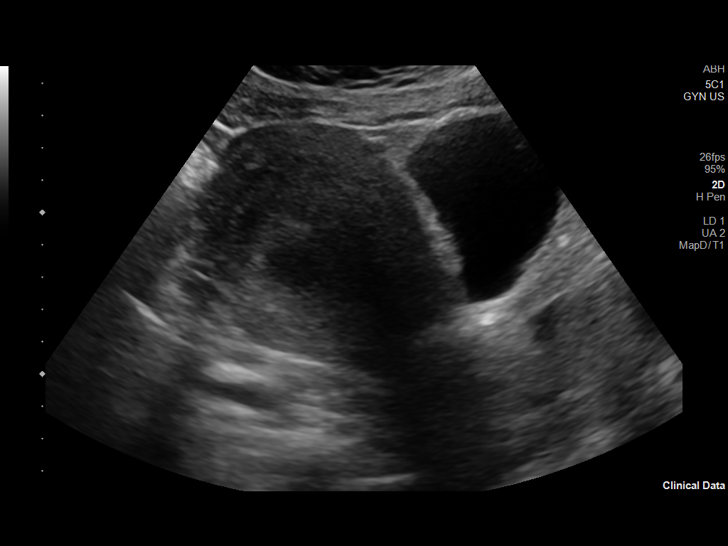
[im 7/81]
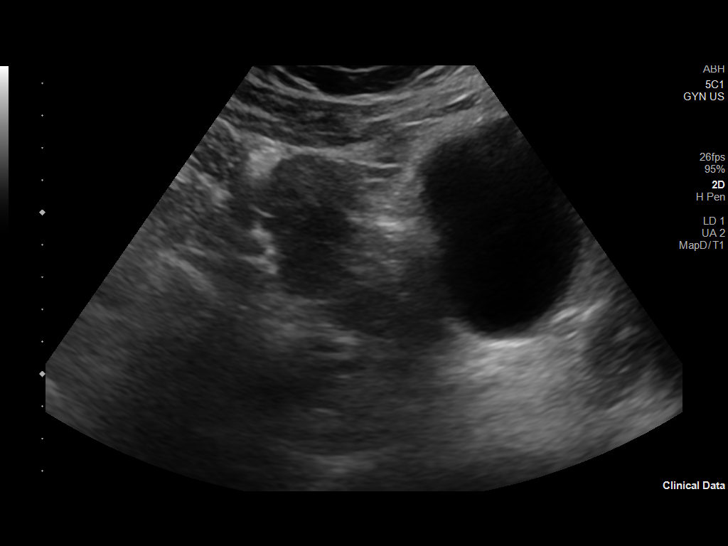
[im 14/81]
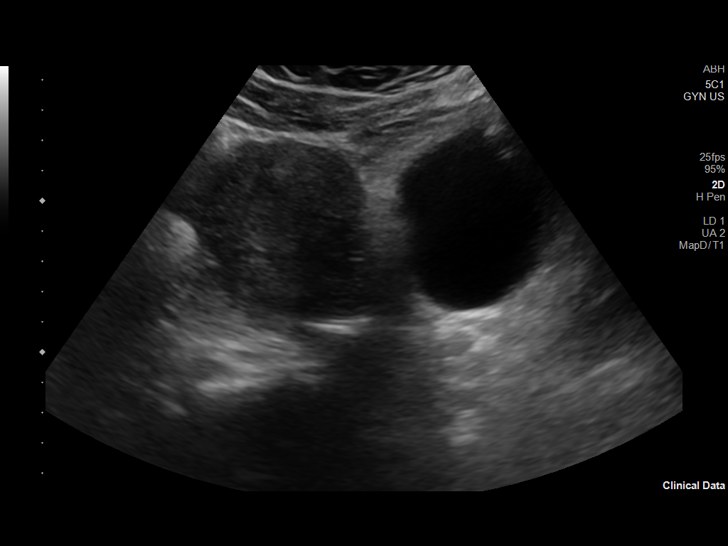
[im 21/81]
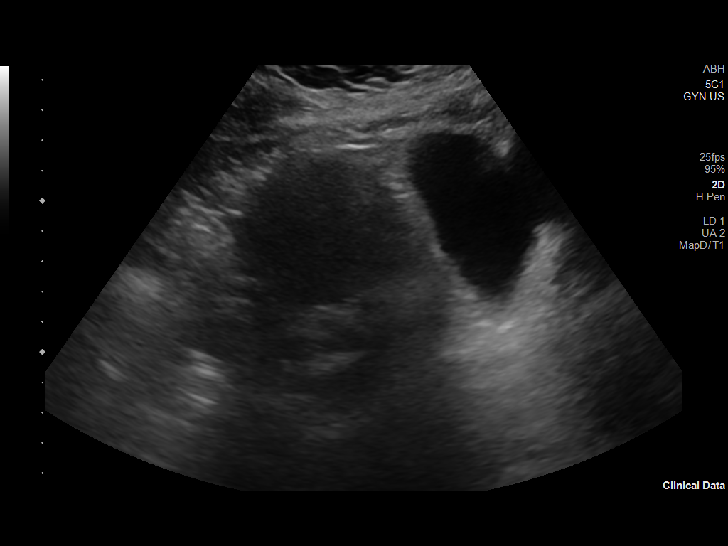
[im 27/81]
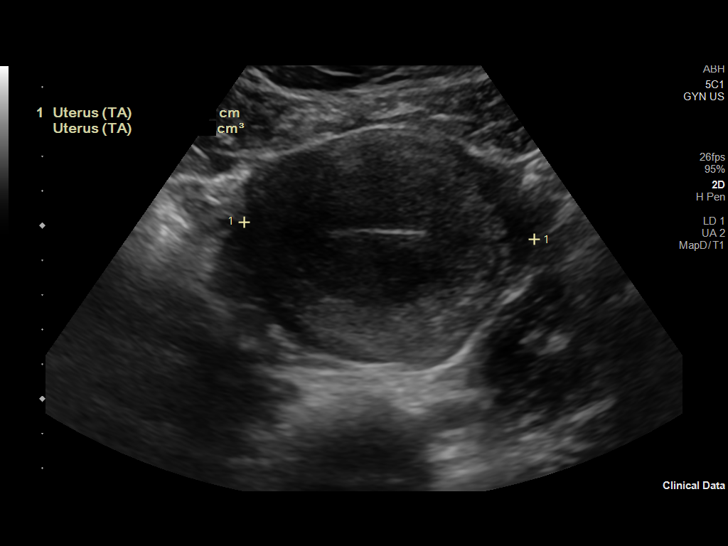
[im 34/81]
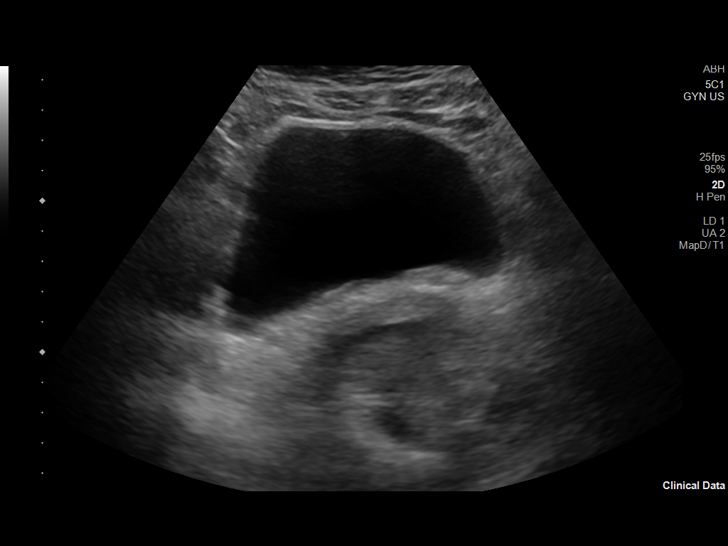
[im 41/81]
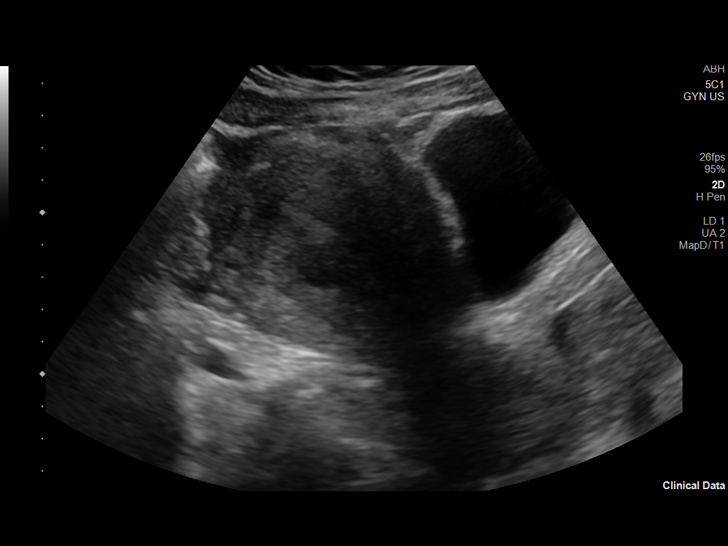
[im 47/81]
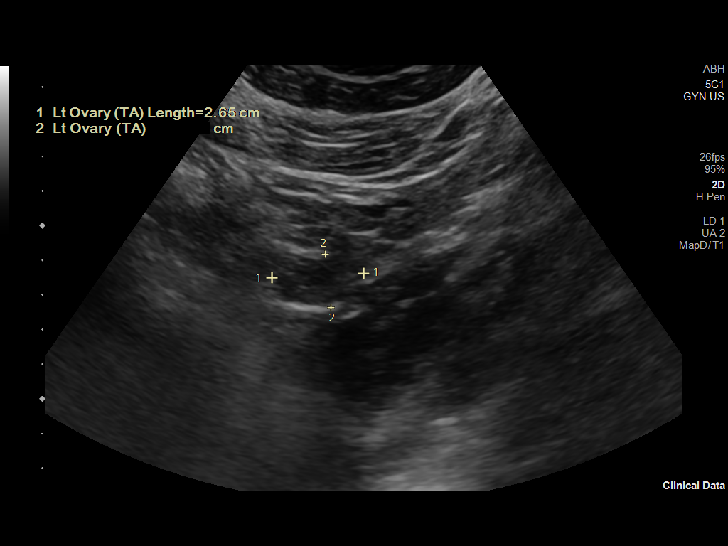
[im 54/81]
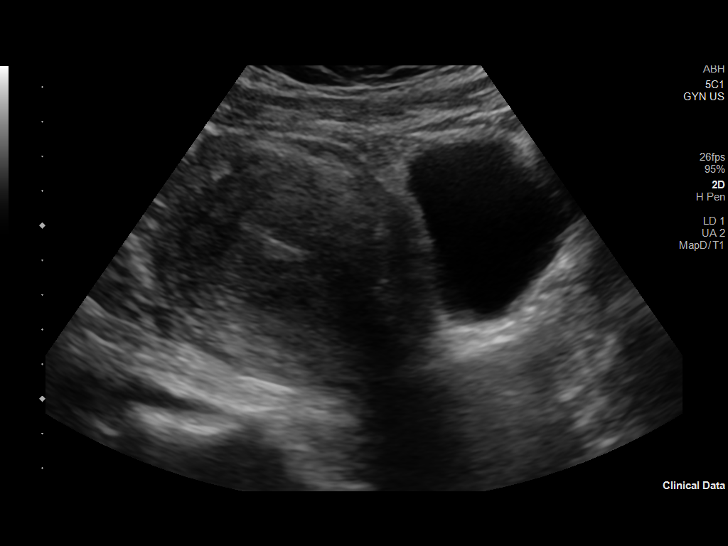
[im 61/81]
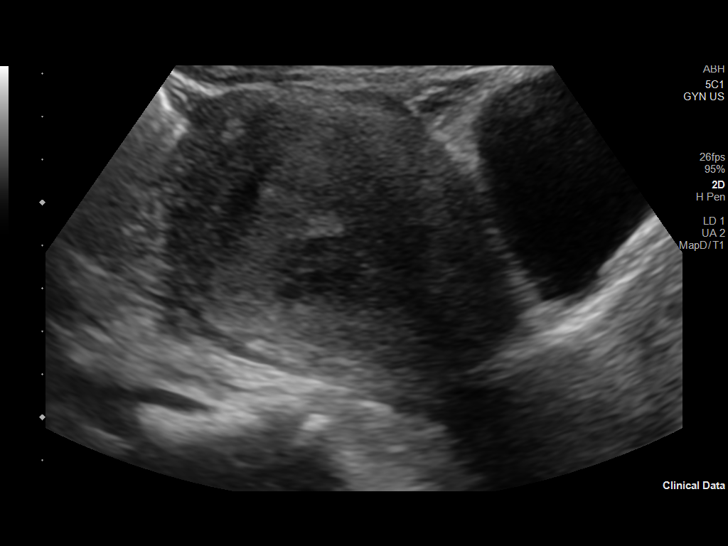
[im 67/81]
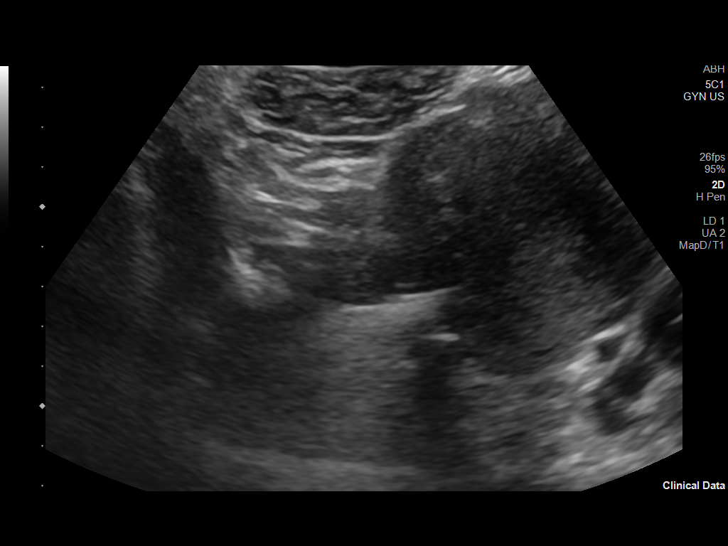
[im 74/81]
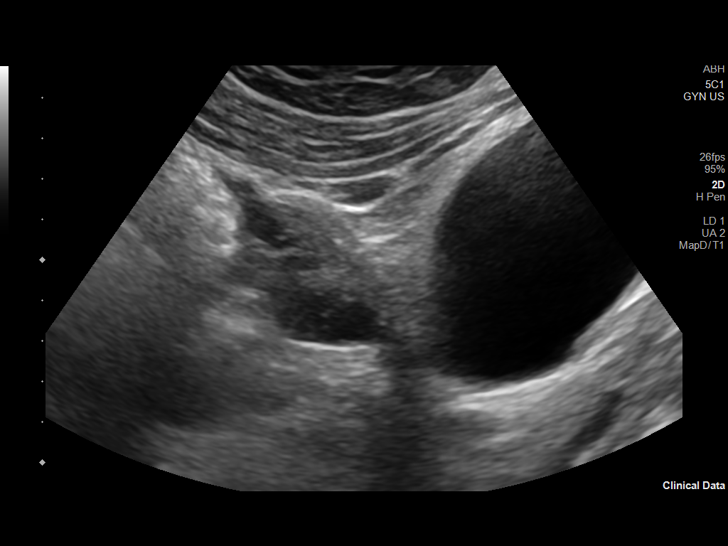
[im 81/81]
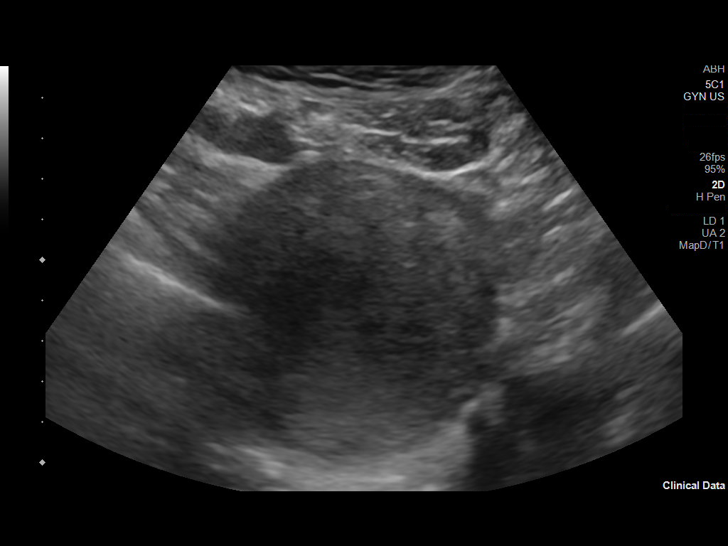

[13 of 25 positions shown; findings below may reference images not displayed]

FINDINGS: Uterus

Measurements: 13.8 x 6.8 x 8.4 cm = volume: 414 ML. Slightly
hypoechoic mass over the midline uterine fundus measuring 3.6 x
x 4.1 cm. Small hypoechoic mass over the posterior body of the
uterus abutting the endometrium measuring 1.4 x 1.7 x 2.1 cm. These
likely represent intramural leiomyomas, although both abut the
endometrium as submucosal component is possible.

Endometrium

Thickness: 2.6 mm.  No focal abnormality visualized.

Right ovary

Measurements: 3.1 x 1.5 x 1.8 cm = volume: 4.3 mL. Normal
appearance/no adnexal mass. Normal color Doppler.

Left ovary

Measurements: 2.7 x 1.6 x 1.8 cm = volume: 3.7 mL. Normal
appearance/no adnexal mass. Normal color Doppler.

Other findings:  No abnormal free fluid.
IMPRESSION: 1. Normal size uterus demonstrating to intramural fibroids with the
largest measuring 4.1 cm over the midline fundus. Both of these
fibroids are likely intramural in nature although both abut the
endometrium as submucosal component is possible.

2.  Normal ovaries.

## 2023-03-10 ENCOUNTER — Encounter: Payer: Self-pay | Admitting: Nurse Practitioner

## 2023-03-11 ENCOUNTER — Telehealth: Payer: Commercial Managed Care - HMO | Admitting: Nurse Practitioner

## 2023-03-11 ENCOUNTER — Encounter: Payer: Self-pay | Admitting: Nurse Practitioner

## 2023-03-11 DIAGNOSIS — F439 Reaction to severe stress, unspecified: Secondary | ICD-10-CM

## 2023-03-11 NOTE — Progress Notes (Signed)
Sent Mychart patient back to patient regarding need for appointment with PCP. [facility] cannot provide paperwork on accomodation or FMLA at this time

## 2023-03-15 ENCOUNTER — Encounter: Payer: Self-pay | Admitting: Nurse Practitioner

## 2023-03-15 ENCOUNTER — Telehealth: Payer: BC Managed Care – PPO | Admitting: Nurse Practitioner

## 2023-03-15 VITALS — Wt 210.0 lb

## 2023-03-15 DIAGNOSIS — Z638 Other specified problems related to primary support group: Secondary | ICD-10-CM | POA: Insufficient documentation

## 2023-03-15 MED ORDER — BUSPIRONE HCL 5 MG PO TABS: 5.0000 mg | ORAL_TABLET | Freq: Two times a day (BID) | ORAL | 3 refills | Status: AC

## 2023-03-15 NOTE — Progress Notes (Signed)
Virtual Visit Encounter mychart visit.   I connected with  Alicia Fry on 03/25/23 at  9:45 AM EDT by secure video and audio telemedicine application. I verified that I am speaking with the correct person using two identifiers.   I introduced myself as a Designer, jewellery with the practice. The limitations of evaluation and management by telemedicine discussed with the patient and the availability of in person appointments. The patient expressed verbal understanding and consent to proceed.  Participating parties in this visit include: Myself and patient  The patient is: Patient Location: Other:  car I am: Provider Location: Office/Clinic Subjective:    CC and HPI: Alicia Fry is a 52 y.o. year old female presenting for new evaluation and treatment of stress and anxiety. Patient reports the following:  Stressors Symiah tells me she is dealing with increased stressors in her life helping out with her family and caring for her 5 children and multiple grandchildren. She also has to help care for her mother and her sister. She tells me that at times she gets calls to help with family during the work day and has to leave work to help. Due to her stress levels, there are times at work that she feels like she just needs a break. She has an appointment with a therapist through her work and has been taking her buspar which is helpful. Her boss recently came to her and suggested that she request FMLA to help protect her job in the times that she needs a break due to her stress. She believes she needs this to start back on March 19 as this is when her boss spoke with her, but she will let me know when she faxes the information over if this is different. She is in need of intermittent FMLA due to anxiety and stress at this time.   She tells me that she has been doing well with her blood pressure. She has been taking her medication and working out on the elliptical at least 3 times a week.   Past  medical history, Surgical history, Family history not pertinant except as noted below, Social history, Allergies, and medications have been entered into the medical record, reviewed, and corrections made.   Review of Systems:  All review of systems negative except what is listed in the HPI  Objective:    Alert and oriented x 4 Speaking in clear sentences with no shortness of breath. No distress.  Impression and Recommendations:    Problem List Items Addressed This Visit     Stress due to family tension - Primary    Alicia Fry is experiencing increased stress and anxiety related to significant needs of her family and her job. She has been trying to take time for herself, but has found that she always seems to be needed by someone else, which has led to her stress levels exacerbated. She has had to miss work due to her stress and anxiety and this concerns her. We discussed FMLA today to help protect her job during times of increased stress and anxiety. I do feel that this would be beneficial to her and reduce some of the burden she is feeling.  Plan: - Patient will have FMLA faxed to the office for completion. - Set boundaries with family and stick with them.  - Buspar filled.         current treatment plan is effective, no change in therapy I discussed the assessment and treatment plan with the patient.  The patient was provided an opportunity to ask questions and all were answered. The patient agreed with the plan and demonstrated an understanding of the instructions.   The patient was advised to call back or seek an in-person evaluation if the symptoms worsen or if the condition fails to improve as anticipated.  Follow-Up: scheduled  I provided 27 minutes of non-face-to-face interaction with this non face-to-face encounter including intake, same-day documentation, and chart review.   Orma Render, NP , DNP, AGNP-c [facility]

## 2023-03-15 NOTE — Assessment & Plan Note (Addendum)
Alicia Fry is experiencing increased stress and anxiety related to significant needs of her family and her job. She has been trying to take time for herself, but has found that she always seems to be needed by someone else, which has led to her stress levels exacerbated. She has had to miss work due to her stress and anxiety and this concerns her. We discussed FMLA today to help protect her job during times of increased stress and anxiety. I do feel that this would be beneficial to her and reduce some of the burden she is feeling.  Plan: - Patient will have FMLA faxed to the office for completion. - Set boundaries with family and stick with them.  - Buspar filled.

## 2023-03-18 ENCOUNTER — Telehealth: Payer: Self-pay | Admitting: Nurse Practitioner

## 2023-03-18 NOTE — Telephone Encounter (Signed)
Dismissal letter in guarantor snapshot  °

## 2023-05-03 ENCOUNTER — Telehealth: Payer: Self-pay | Admitting: Nurse Practitioner

## 2023-05-03 NOTE — Telephone Encounter (Signed)
Just wanted to inform you of the pt  Pt called regarding her fmla. Faxed it she states that it was never sent, she was talking to breanna,and then wanted to know her balance, I informed pt what it was, she did not want to talk to me she wanted to be back on the phone with the black girl she said said she did not want to talk to the while girl, and she was tired of hearing from me what her balance was, she was yelling at me and I informed her to not talk to me in that maner, as bre can not look up balance and money. She said bre keep putting her on hold I informed her that she was asking me questions about her balance and where the fmla forms would be, and said she was on break and didn't have time for this and keep saying she wanting to talk to the black girl, She got back on the phone with bre, she was very rude and disrespectful.

## 2023-05-25 ENCOUNTER — Encounter: Payer: Commercial Managed Care - HMO | Admitting: Nurse Practitioner

## 2023-07-25 ENCOUNTER — Other Ambulatory Visit: Payer: Self-pay | Admitting: Nurse Practitioner

## 2023-11-17 ENCOUNTER — Telehealth: Payer: Self-pay | Admitting: Family Medicine

## 2023-11-17 NOTE — Telephone Encounter (Signed)
Alicia Fry wants to know if you will take her back as a patient.  She said her money wasn't acting right back then.  And she will be out of meds in 37 tablets.

## 2023-11-30 ENCOUNTER — Encounter: Payer: Self-pay | Admitting: Family Medicine

## 2023-11-30 NOTE — Telephone Encounter (Signed)
Sent note to patient we will not be taking her back.

## 2024-01-31 DIAGNOSIS — F331 Major depressive disorder, recurrent, moderate: Secondary | ICD-10-CM | POA: Diagnosis not present

## 2024-02-07 DIAGNOSIS — F411 Generalized anxiety disorder: Secondary | ICD-10-CM | POA: Diagnosis not present

## 2024-02-21 DIAGNOSIS — F331 Major depressive disorder, recurrent, moderate: Secondary | ICD-10-CM | POA: Diagnosis not present

## 2024-03-08 DIAGNOSIS — F331 Major depressive disorder, recurrent, moderate: Secondary | ICD-10-CM | POA: Diagnosis not present

## 2024-03-20 DIAGNOSIS — F411 Generalized anxiety disorder: Secondary | ICD-10-CM | POA: Diagnosis not present

## 2024-03-31 DIAGNOSIS — Z6834 Body mass index (BMI) 34.0-34.9, adult: Secondary | ICD-10-CM | POA: Diagnosis not present

## 2024-03-31 DIAGNOSIS — Z13220 Encounter for screening for lipoid disorders: Secondary | ICD-10-CM | POA: Diagnosis not present

## 2024-03-31 DIAGNOSIS — Z0000 Encounter for general adult medical examination without abnormal findings: Secondary | ICD-10-CM | POA: Diagnosis not present

## 2024-03-31 DIAGNOSIS — E6609 Other obesity due to excess calories: Secondary | ICD-10-CM | POA: Diagnosis not present

## 2024-03-31 DIAGNOSIS — R030 Elevated blood-pressure reading, without diagnosis of hypertension: Secondary | ICD-10-CM | POA: Diagnosis not present

## 2024-03-31 DIAGNOSIS — R5383 Other fatigue: Secondary | ICD-10-CM | POA: Diagnosis not present

## 2024-03-31 DIAGNOSIS — Z131 Encounter for screening for diabetes mellitus: Secondary | ICD-10-CM | POA: Diagnosis not present

## 2024-04-10 DIAGNOSIS — F4322 Adjustment disorder with anxiety: Secondary | ICD-10-CM | POA: Diagnosis not present

## 2024-04-17 DIAGNOSIS — F411 Generalized anxiety disorder: Secondary | ICD-10-CM | POA: Diagnosis not present

## 2024-04-20 DIAGNOSIS — I10 Essential (primary) hypertension: Secondary | ICD-10-CM | POA: Diagnosis not present

## 2024-04-20 DIAGNOSIS — R7303 Prediabetes: Secondary | ICD-10-CM | POA: Diagnosis not present

## 2024-04-20 DIAGNOSIS — E7800 Pure hypercholesterolemia, unspecified: Secondary | ICD-10-CM | POA: Diagnosis not present

## 2024-04-20 DIAGNOSIS — E6609 Other obesity due to excess calories: Secondary | ICD-10-CM | POA: Diagnosis not present

## 2024-04-25 DIAGNOSIS — Z1231 Encounter for screening mammogram for malignant neoplasm of breast: Secondary | ICD-10-CM | POA: Diagnosis not present

## 2024-04-26 DIAGNOSIS — Z83719 Family history of colon polyps, unspecified: Secondary | ICD-10-CM | POA: Diagnosis not present

## 2024-04-26 DIAGNOSIS — Z1211 Encounter for screening for malignant neoplasm of colon: Secondary | ICD-10-CM | POA: Diagnosis not present

## 2024-05-01 DIAGNOSIS — Z1211 Encounter for screening for malignant neoplasm of colon: Secondary | ICD-10-CM | POA: Diagnosis not present

## 2024-05-01 DIAGNOSIS — F411 Generalized anxiety disorder: Secondary | ICD-10-CM | POA: Diagnosis not present

## 2024-05-01 DIAGNOSIS — K635 Polyp of colon: Secondary | ICD-10-CM | POA: Diagnosis not present

## 2024-05-15 DIAGNOSIS — K635 Polyp of colon: Secondary | ICD-10-CM | POA: Diagnosis not present

## 2024-05-18 DIAGNOSIS — E7800 Pure hypercholesterolemia, unspecified: Secondary | ICD-10-CM | POA: Diagnosis not present

## 2024-05-18 DIAGNOSIS — R0602 Shortness of breath: Secondary | ICD-10-CM | POA: Diagnosis not present

## 2024-05-18 DIAGNOSIS — I10 Essential (primary) hypertension: Secondary | ICD-10-CM | POA: Diagnosis not present

## 2024-05-18 DIAGNOSIS — R7303 Prediabetes: Secondary | ICD-10-CM | POA: Diagnosis not present

## 2024-05-25 DIAGNOSIS — F4322 Adjustment disorder with anxiety: Secondary | ICD-10-CM | POA: Diagnosis not present

## 2024-05-29 DIAGNOSIS — F411 Generalized anxiety disorder: Secondary | ICD-10-CM | POA: Diagnosis not present

## 2024-05-31 DIAGNOSIS — R030 Elevated blood-pressure reading, without diagnosis of hypertension: Secondary | ICD-10-CM | POA: Diagnosis not present

## 2024-05-31 DIAGNOSIS — D126 Benign neoplasm of colon, unspecified: Secondary | ICD-10-CM | POA: Diagnosis not present

## 2024-05-31 DIAGNOSIS — K648 Other hemorrhoids: Secondary | ICD-10-CM | POA: Diagnosis not present

## 2024-05-31 DIAGNOSIS — Z6835 Body mass index (BMI) 35.0-35.9, adult: Secondary | ICD-10-CM | POA: Diagnosis not present

## 2024-05-31 DIAGNOSIS — K5730 Diverticulosis of large intestine without perforation or abscess without bleeding: Secondary | ICD-10-CM | POA: Diagnosis not present

## 2024-06-12 DIAGNOSIS — F331 Major depressive disorder, recurrent, moderate: Secondary | ICD-10-CM | POA: Diagnosis not present

## 2024-06-19 DIAGNOSIS — Z6834 Body mass index (BMI) 34.0-34.9, adult: Secondary | ICD-10-CM | POA: Diagnosis not present

## 2024-06-19 DIAGNOSIS — R030 Elevated blood-pressure reading, without diagnosis of hypertension: Secondary | ICD-10-CM | POA: Diagnosis not present

## 2024-06-19 DIAGNOSIS — I10 Essential (primary) hypertension: Secondary | ICD-10-CM | POA: Diagnosis not present

## 2024-06-19 DIAGNOSIS — F411 Generalized anxiety disorder: Secondary | ICD-10-CM | POA: Diagnosis not present

## 2024-06-19 DIAGNOSIS — R7303 Prediabetes: Secondary | ICD-10-CM | POA: Diagnosis not present

## 2024-08-17 DIAGNOSIS — F411 Generalized anxiety disorder: Secondary | ICD-10-CM | POA: Diagnosis not present

## 2024-09-26 DIAGNOSIS — Z79899 Other long term (current) drug therapy: Secondary | ICD-10-CM | POA: Diagnosis not present

## 2024-09-26 DIAGNOSIS — R7303 Prediabetes: Secondary | ICD-10-CM | POA: Diagnosis not present

## 2024-09-26 DIAGNOSIS — E7800 Pure hypercholesterolemia, unspecified: Secondary | ICD-10-CM | POA: Diagnosis not present

## 2024-09-26 DIAGNOSIS — E559 Vitamin D deficiency, unspecified: Secondary | ICD-10-CM | POA: Diagnosis not present

## 2024-09-26 DIAGNOSIS — I10 Essential (primary) hypertension: Secondary | ICD-10-CM | POA: Diagnosis not present

## 2024-11-10 NOTE — Progress Notes (Signed)
 Alicia Fry                                          MRN: [id number]   11/10/2024   The [facility] Quality Team Specialist reviewed this patient medical record for the purposes of chart review for care gap closure. The following were reviewed: chart review for care gap closure-controlling blood pressure.    [facility] Quality Team

## 2024-11-13 DIAGNOSIS — F411 Generalized anxiety disorder: Secondary | ICD-10-CM | POA: Diagnosis not present

## 2024-11-27 DIAGNOSIS — F411 Generalized anxiety disorder: Secondary | ICD-10-CM | POA: Diagnosis not present

## 2025-01-07 ENCOUNTER — Encounter (HOSPITAL_COMMUNITY): Payer: Self-pay

## 2025-01-07 ENCOUNTER — Ambulatory Visit (HOSPITAL_COMMUNITY): Admission: EM | Admit: 2025-01-07 | Discharge: 2025-01-07 | Disposition: A | Discharge status: Home / Self Care

## 2025-01-07 DIAGNOSIS — S161XXA Strain of muscle, fascia and tendon at neck level, initial encounter: Secondary | ICD-10-CM | POA: Diagnosis not present

## 2025-01-07 MED ORDER — CYCLOBENZAPRINE HCL 10 MG PO TABS: 10.0000 mg | ORAL_TABLET | Freq: Two times a day (BID) | ORAL | 0 refills | Status: AC | PRN

## 2025-01-07 MED ORDER — DICLOFENAC SODIUM 50 MG PO TBEC: 50.0000 mg | DELAYED_RELEASE_TABLET | Freq: Two times a day (BID) | ORAL | 1 refills | Status: AC

## 2025-01-07 MED ORDER — KETOROLAC TROMETHAMINE 30 MG/ML IJ SOLN
INTRAMUSCULAR | Status: AC
  Filled 2025-01-07: qty 1

## 2025-01-07 MED ORDER — KETOROLAC TROMETHAMINE 30 MG/ML IJ SOLN
30.0000 mg | Freq: Once | INTRAMUSCULAR | Status: AC
  Administered 2025-01-07: 30 mg via INTRAMUSCULAR

## 2025-01-07 NOTE — Discharge Instructions (Signed)
" °  1. Neck muscle strain, initial encounter (Primary) - ketorolac  (TORADOL ) 30 MG/ML injection 30 mg given in UC for acute muscle pain and inflammation. - diclofenac  (VOLTAREN ) 50 MG EC tablet; Take 1 tablet (50 mg total) by mouth 2 (two) times daily.  Dispense: 30 tablet; Refill: 1 - cyclobenzaprine  (FLEXERIL ) 10 MG tablet; Take 1 tablet (10 mg total) by mouth 2 (two) times daily as needed for muscle spasms.  Dispense: 20 tablet; Refill: 0  -Continue to monitor symptoms for any change in severity if there is any escalation of current symptoms or development of new symptoms follow-up in ER for further evaluation and management. "
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
